# Patient Record
Sex: Female | Born: 1995 | Race: White | Hispanic: No | Smoking: Former smoker
Health system: Southern US, Community
[De-identification: ages and names within clinical notes are randomized; demographics above are authoritative.]

## PROBLEM LIST (undated history)

## (undated) DIAGNOSIS — G90A Postural orthostatic tachycardia syndrome (POTS): Secondary | ICD-10-CM

## (undated) DIAGNOSIS — I951 Orthostatic hypotension: Secondary | ICD-10-CM

## (undated) DIAGNOSIS — J189 Pneumonia, unspecified organism: Secondary | ICD-10-CM

## (undated) DIAGNOSIS — F329 Major depressive disorder, single episode, unspecified: Secondary | ICD-10-CM

## (undated) DIAGNOSIS — K219 Gastro-esophageal reflux disease without esophagitis: Secondary | ICD-10-CM

## (undated) DIAGNOSIS — E119 Type 2 diabetes mellitus without complications: Secondary | ICD-10-CM

## (undated) DIAGNOSIS — R000 Tachycardia, unspecified: Secondary | ICD-10-CM

## (undated) DIAGNOSIS — I498 Other specified cardiac arrhythmias: Secondary | ICD-10-CM

## (undated) DIAGNOSIS — R630 Anorexia: Secondary | ICD-10-CM

## (undated) DIAGNOSIS — Z9889 Other specified postprocedural states: Secondary | ICD-10-CM

## (undated) DIAGNOSIS — F32A Depression, unspecified: Secondary | ICD-10-CM

## (undated) DIAGNOSIS — F419 Anxiety disorder, unspecified: Secondary | ICD-10-CM

## (undated) DIAGNOSIS — I499 Cardiac arrhythmia, unspecified: Secondary | ICD-10-CM

## (undated) HISTORY — PX: NASAL SEPTUM SURGERY: SHX37

## (undated) HISTORY — PX: OVARIAN CYST REMOVAL: SHX89

## (undated) HISTORY — PX: APPENDECTOMY: SHX54

## (undated) HISTORY — PX: DILATION AND CURETTAGE OF UTERUS: SHX78

---
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
## 2017-03-11 ENCOUNTER — Encounter: Payer: Self-pay | Admitting: Certified Nurse Midwife

## 2017-07-11 ENCOUNTER — Encounter: Payer: Self-pay | Admitting: Emergency Medicine

## 2017-07-11 ENCOUNTER — Emergency Department
Admission: EM | Admit: 2017-07-11 | Discharge: 2017-07-11 | Disposition: A | Discharge status: Home / Self Care | Payer: Medicaid Other | Attending: Student in an Organized Health Care Education/Training Program | Admitting: Student in an Organized Health Care Education/Training Program

## 2017-07-11 ENCOUNTER — Other Ambulatory Visit: Payer: Self-pay

## 2017-07-11 DIAGNOSIS — F17210 Nicotine dependence, cigarettes, uncomplicated: Secondary | ICD-10-CM | POA: Insufficient documentation

## 2017-07-11 DIAGNOSIS — J020 Streptococcal pharyngitis: Secondary | ICD-10-CM | POA: Diagnosis not present

## 2017-07-11 DIAGNOSIS — R509 Fever, unspecified: Secondary | ICD-10-CM | POA: Diagnosis present

## 2017-07-11 HISTORY — DX: Depression, unspecified: F32.A

## 2017-07-11 HISTORY — DX: Major depressive disorder, single episode, unspecified: F32.9

## 2017-07-11 HISTORY — DX: Gastro-esophageal reflux disease without esophagitis: K21.9

## 2017-07-11 MED ORDER — AMOXICILLIN 500 MG PO CAPS
500.0000 mg | ORAL_CAPSULE | Freq: Once | ORAL | Status: AC
  Administered 2017-07-11: 500 mg via ORAL
  Filled 2017-07-11: qty 1

## 2017-07-11 MED ORDER — AMOXICILLIN 500 MG PO TABS: 500.0000 mg | ORAL_TABLET | Freq: Two times a day (BID) | ORAL | 0 refills | Status: AC

## 2017-07-11 MED ORDER — DEXAMETHASONE SODIUM PHOSPHATE 10 MG/ML IJ SOLN
10.0000 mg | Freq: Once | INTRAMUSCULAR | Status: AC
  Administered 2017-07-11: 10 mg via INTRAMUSCULAR
  Filled 2017-07-11: qty 1

## 2017-07-11 NOTE — ED Provider Notes (Signed)
[facility] Emergency Department Provider Note  ____________________________________________  Time seen: Approximately 10:34 PM  I have reviewed the triage vital signs and the nursing notes.   HISTORY  Chief Complaint Fever    HPI Alyssa Evans is a 22 y.o. female presents to the emergency department with pharyngitis, headache, diminished appetite and sweats for the past 3 days.  Patient has numerous sick contacts at work.  She reports that she has pain with swallowing but is managing her own secretions.  No associated rhinorrhea, congestion or nonproductive cough.   Past Medical History:  Diagnosis Date  . Acid reflux   . Anorexia   . Depression   . POTS (postural orthostatic tachycardia syndrome)     There are no active problems to display for this patient.   Past Surgical History:  Procedure Laterality Date  . APPENDECTOMY    . NASAL SEPTUM SURGERY      Prior to Admission medications   Medication Sig Start Date End Date Taking? Authorizing Provider  albuterol (PROVENTIL HFA;VENTOLIN HFA) 108 (90 Base) MCG/ACT inhaler Inhale into the lungs. 10/21/14 04/07/16  [provider]  amoxicillin (AMOXIL) 500 MG tablet Take 1 tablet (500 mg total) by mouth 2 (two) times daily for 10 days. 07/11/17 07/21/17  Orvil FeilWoods, Nyla Creason M, PA-C  fluticasone (FLONASE) 50 MCG/ACT nasal spray Place 2 sprays into both nostrils daily. Patient taking differently: Place 2 sprays into both nostrils daily as needed.  09/05/15   Hagler, Jami L, PA-C  sodium chloride 1 g tablet Take 1 tablet (1 g total) by mouth 3 (three) times daily with meals. 12/21/15   End, Cristal Deerhristopher, MD  traZODone (DESYREL) 50 MG tablet Take 50 mg by mouth daily as needed.  10/21/14 04/07/16  [provider]    Allergies Patient has no known allergies.  Family History  Problem Relation Age of Onset  . Hyperlipidemia Mother   . Hypertension Mother   . Diabetes gravidarum Mother    . Heart disease Brother 17       ? slight heart attack  . Syncope episode Brother   . Heart attack Maternal Grandmother     Social History Social History   Tobacco Use  . Smoking status: Current Every Day Smoker    Packs/day: 0.50    Years: 3.00    Pack years: 1.50    Types: Cigarettes  . Smokeless tobacco: Never Used  . Tobacco comment: somedays pt smoke pack a day.  Substance Use Topics  . Alcohol use: No    Alcohol/week: 0.6 oz    Types: 1 Cans of beer per week    Comment: socially  . Drug use: Yes    Types: Marijuana    Comment: None recently     Review of Systems  Constitutional: Patient has fever.  Eyes: No visual changes. No discharge ENT: Patient has pharyngitis.  Cardiovascular: no chest pain. Respiratory: no cough. No SOB. Gastrointestinal: No abdominal pain. Patient has had nausea. No diarrhea.  No constipation. Musculoskeletal: Negative for musculoskeletal pain. Skin: Negative for rash, abrasions, lacerations, ecchymosis. Neurological: Negative for headaches, focal weakness or numbness.  ____________________________________________   PHYSICAL EXAM:  VITAL SIGNS: ED Triage Vitals  Enc Vitals Group     BP 07/11/17 2214 115/80     Pulse Rate 07/11/17 2214 100     Resp 07/11/17 2214 16     Temp 07/11/17 2214 100.2 F (37.9 C)     Temp Source 07/11/17 2214 Oral  SpO2 07/11/17 2214 98 %     Weight 07/11/17 2215 108 lb (49 kg)     Height 07/11/17 2215 5\' 7"  (1.702 m)     Head Circumference --      Peak Flow --      Pain Score 07/11/17 2217 8     Pain Loc --      Pain Edu? --      Excl. in GC? --      Constitutional: Alert and oriented. Well appearing and in no acute distress. Eyes: Conjunctivae are normal. PERRL. EOMI. Head: Atraumatic. ENT:      Ears: TMs are pearly.      Nose: No congestion/rhinnorhea.      Mouth/Throat: Mucous membranes are moist.  Posterior pharynx is erythematous with tonsillar exudate and hypertrophy.  Uvula is  midline. Neck: No stridor.  No cervical spine tenderness to palpation. Hematological/Lymphatic/Immunilogical:  palpable cervical lymphadenopathy. Cardiovascular: Normal rate, regular rhythm. Normal S1 and S2.  Good peripheral circulation. Respiratory: Normal respiratory effort without tachypnea or retractions. Lungs CTAB. Good air entry to the bases with no decreased or absent breath sounds. Gastrointestinal: Bowel sounds 4 quadrants. Soft and nontender to palpation. No guarding or rigidity. No palpable masses. No distention. No CVA tenderness. Skin:  Skin is warm, dry and intact. No rash noted. ____________________________________________   LABS (all labs ordered are listed, but only abnormal results are displayed)  Labs Reviewed - No data to display ____________________________________________  EKG   ____________________________________________  RADIOLOGY   No results found.  ____________________________________________    PROCEDURES  Procedure(s) performed:    Procedures    Medications  amoxicillin (AMOXIL) capsule 500 mg (has no administration in time range)  dexamethasone (DECADRON) injection 10 mg (has no administration in time range)     ____________________________________________   INITIAL IMPRESSION / ASSESSMENT AND PLAN / ED COURSE  Pertinent labs & imaging results that were available during my care of the patient were reviewed by me and considered in my medical decision making (see chart for details).  Review of the [place] CSRS was performed in accordance of the [facility] prior to dispensing any controlled drugs.      Assessment and plan Strep pharyngitis Patient presents to the emergency department with headache, fever, pharyngitis and absence of cough.  History and physical exam findings are consistent with strep pharyngitis.  Patient was treated empirically with amoxicillin and advised to follow-up with primary care as needed.  All patient questions  were answered.    ____________________________________________  FINAL CLINICAL IMPRESSION(S) / ED DIAGNOSES  Final diagnoses:  Strep throat      NEW MEDICATIONS STARTED DURING THIS VISIT:  ED Discharge Orders        Ordered    amoxicillin (AMOXIL) 500 MG tablet  2 times daily     07/11/17 2232          This chart was dictated using voice recognition software/Dragon. Despite best efforts to proofread, errors can occur which can change the meaning. Any change was purely unintentional.    Orvil Feil, PA-C 07/11/17 2239    Willy Eddy, MD 07/11/17 2340

## 2017-07-11 NOTE — ED Triage Notes (Signed)
Pt to ED from home c/o fever, sore throat, and body aches x3 days, last had tylenol around 2100 tonight.

## 2017-07-11 NOTE — ED Notes (Signed)
Pt states she has been having fever for the past 3 days, states she has been taking nyquil or theraflu or mucinex.  Pt also c/o having sweats, body aches, sore throat and some congestion.  Pt also c/o poor appetite.  Pt wrapped in blanket lying on bed.

## 2017-09-11 ENCOUNTER — Other Ambulatory Visit: Payer: Self-pay

## 2017-09-11 ENCOUNTER — Emergency Department
Admission: EM | Admit: 2017-09-11 | Discharge: 2017-09-11 | Disposition: A | Discharge status: Home / Self Care | Payer: Medicaid Other | Attending: Emergency Medicine | Admitting: Emergency Medicine

## 2017-09-11 ENCOUNTER — Encounter: Payer: Self-pay | Admitting: Emergency Medicine

## 2017-09-11 DIAGNOSIS — R21 Rash and other nonspecific skin eruption: Secondary | ICD-10-CM | POA: Insufficient documentation

## 2017-09-11 DIAGNOSIS — L659 Nonscarring hair loss, unspecified: Secondary | ICD-10-CM | POA: Insufficient documentation

## 2017-09-11 NOTE — ED Triage Notes (Signed)
Patient ambulatory to triage with steady gait, without difficulty or distress noted; pt reports noting bald spot to back of scalp with rash to neck

## 2018-01-07 IMAGING — DX DG FOOT COMPLETE 3+V*R*
3 series · 3 of 3 positions shown · non-contrast
Comparison: No prior.

CLINICAL DATA: Right foot pain.  No known injury.

EXAM:
RIGHT FOOT COMPLETE - 3+ VIEW

[foot ap]
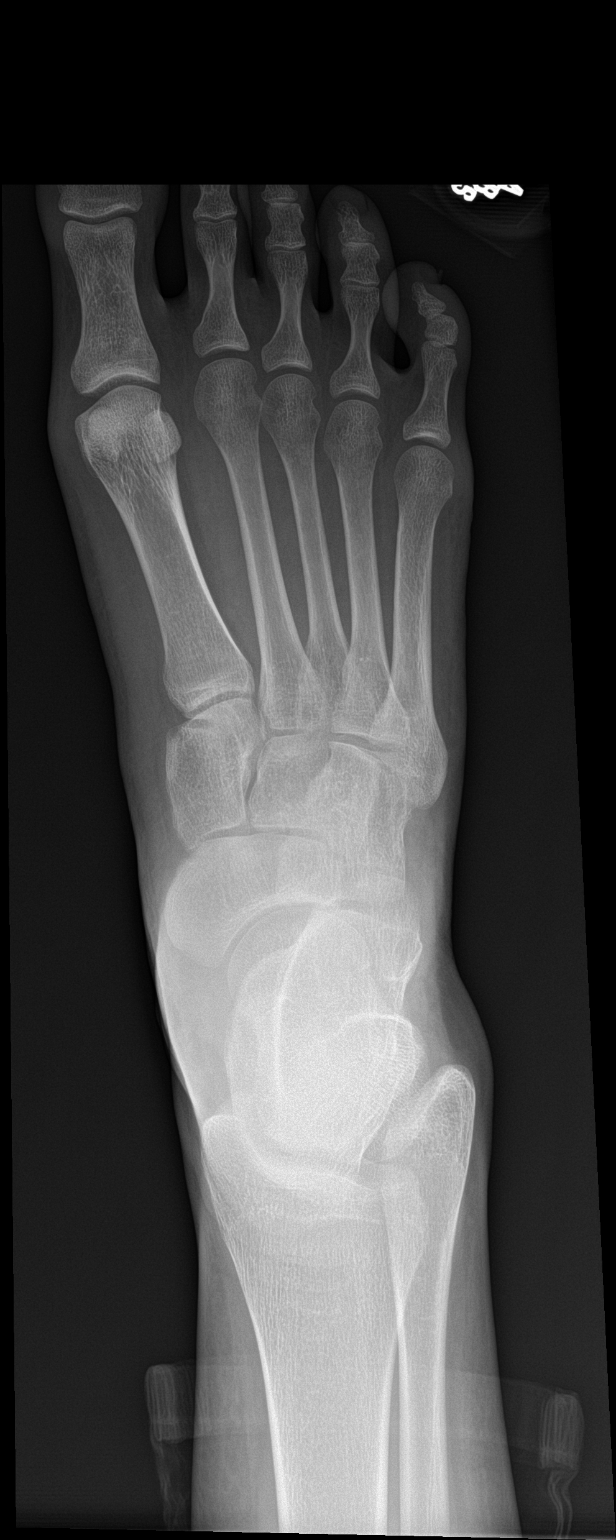

[foot obl]
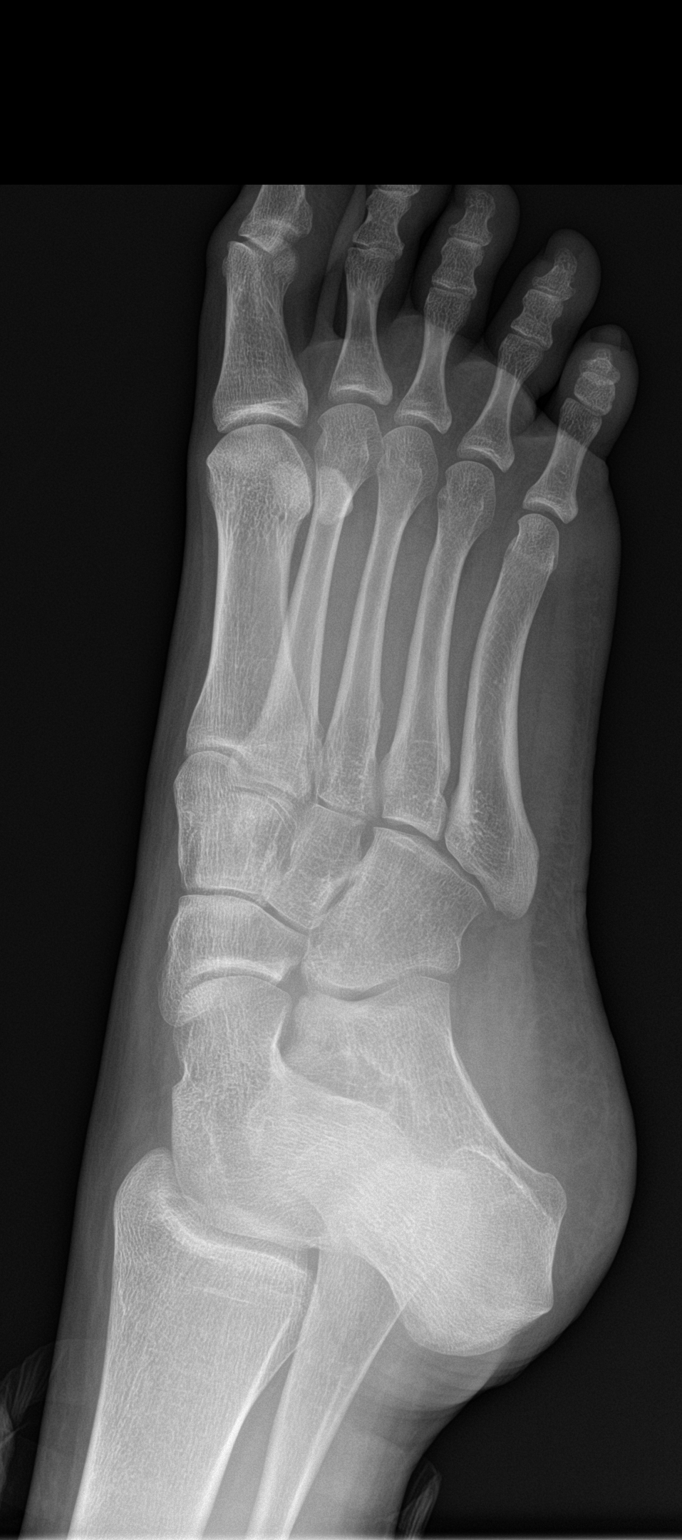

[foot lat]
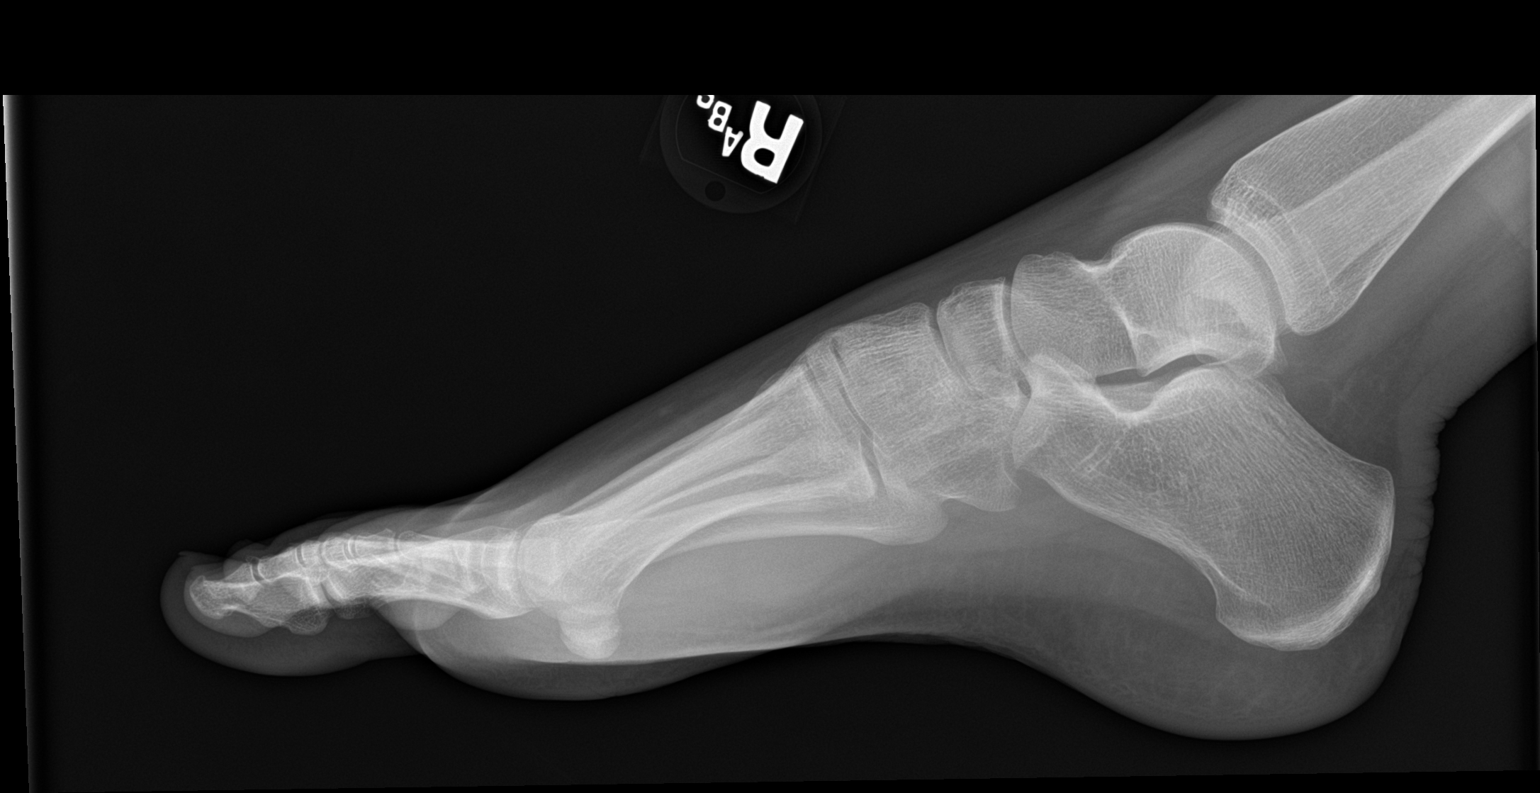

[3 of 3 positions shown; findings below may reference images not displayed]

FINDINGS: No acute bony or joint abnormality. No focal abnormality. No
radiopaque foreign body.
IMPRESSION: No acute abnormality .

## 2018-01-13 IMAGING — CR DG CHEST 2V
2 series · 2 of 2 positions shown · non-contrast
Comparison: None.

CLINICAL DATA: Hematemesis 2 days ago.  Productive cough.

EXAM:
CHEST  2 VIEW

[chest pa]
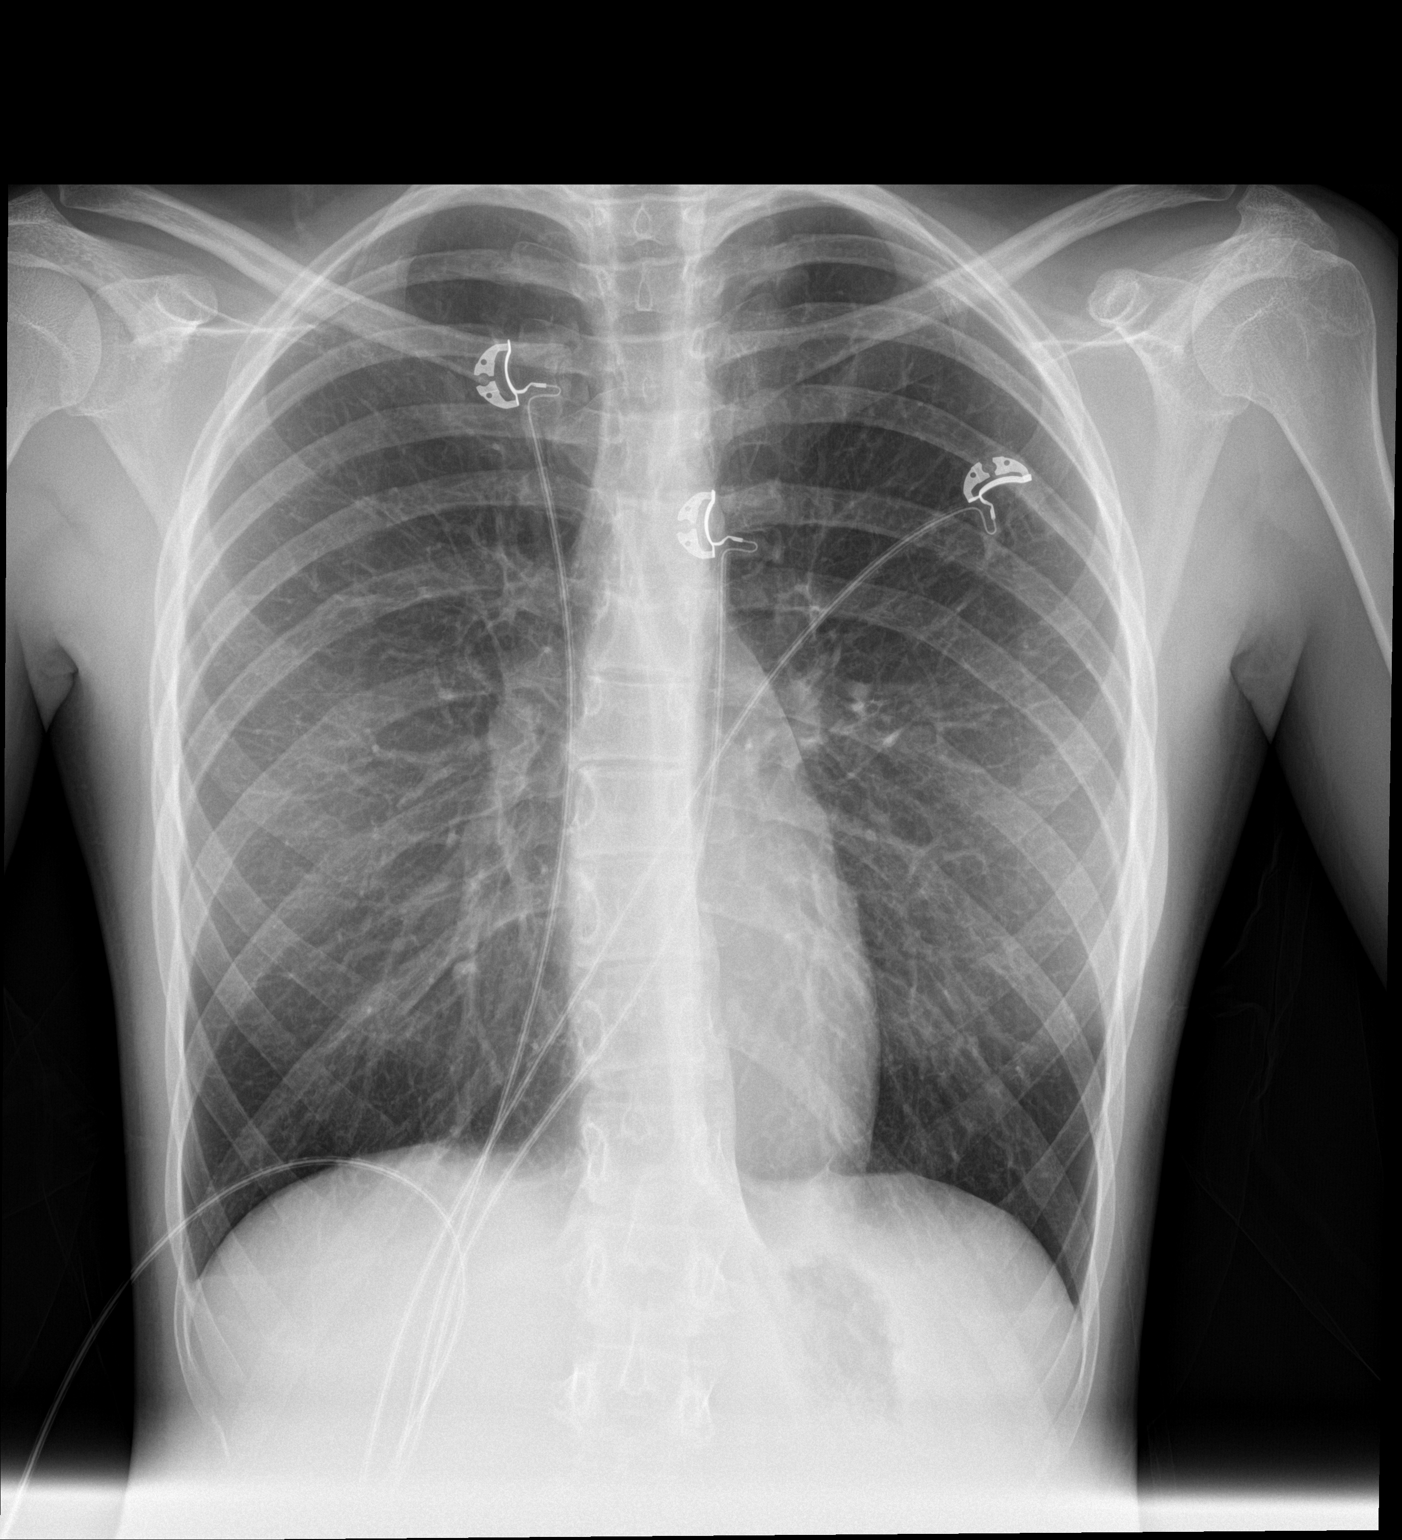

[chest lat]
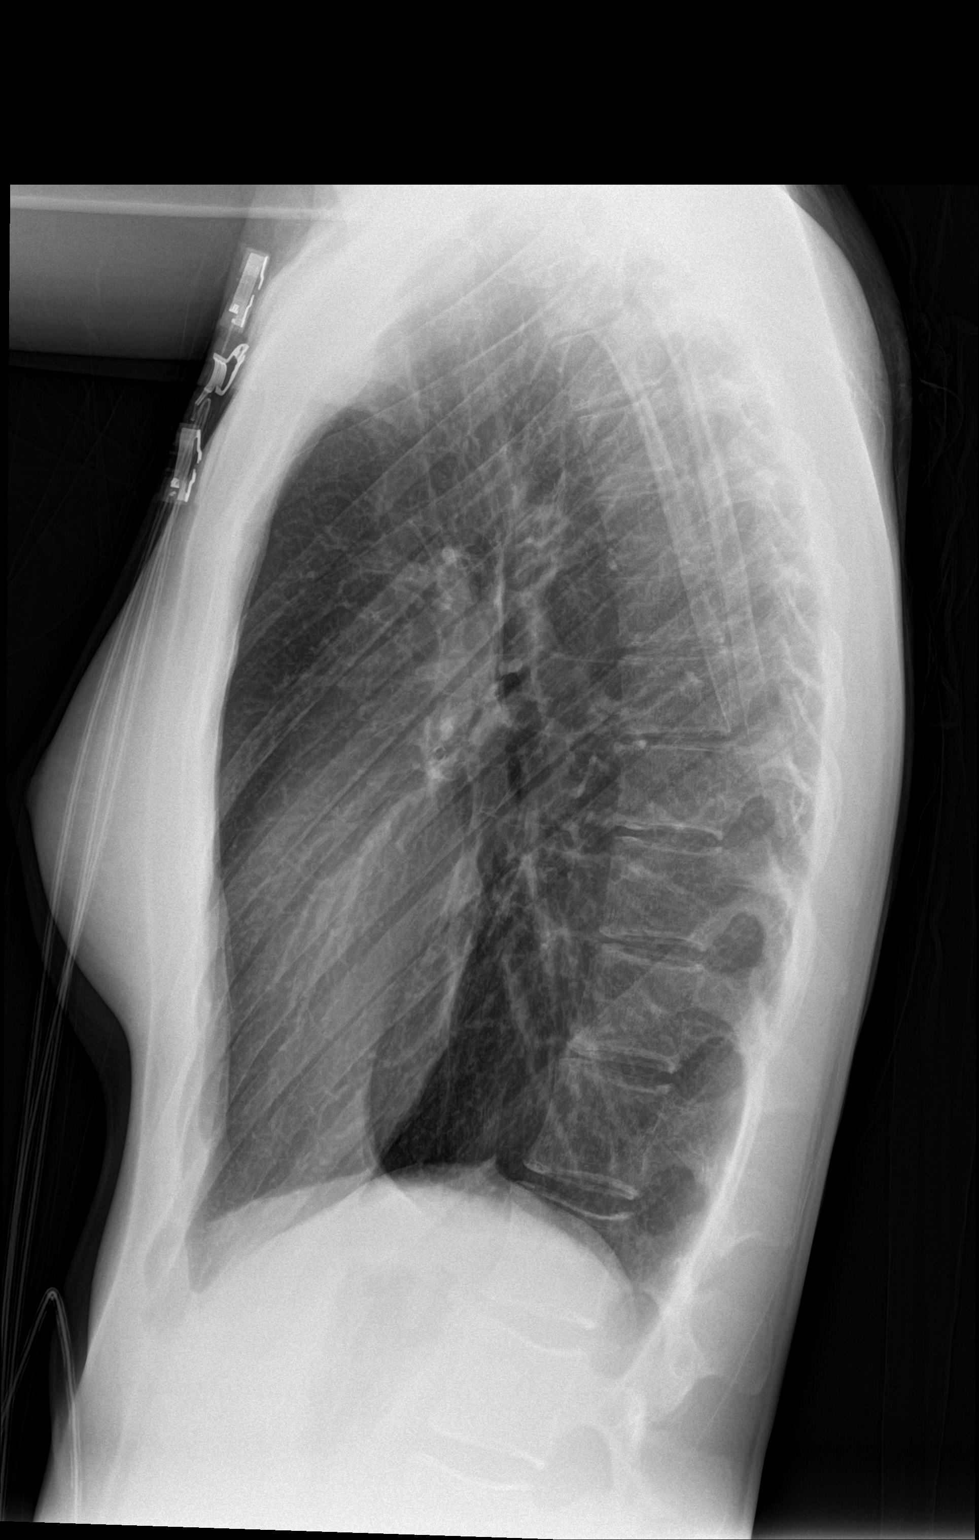

[2 of 2 positions shown; findings below may reference images not displayed]

FINDINGS: Lungs are hyperaerated and clear. Heart is normal in size. No
pneumothorax. No pleural effusion.
IMPRESSION: Hyperaeration.  Otherwise, no active cardiopulmonary disease new

## 2018-03-07 ENCOUNTER — Other Ambulatory Visit: Payer: Self-pay

## 2018-03-07 ENCOUNTER — Emergency Department
Admission: EM | Admit: 2018-03-07 | Discharge: 2018-03-07 | Disposition: A | Discharge status: Home / Self Care | Payer: Self-pay | Attending: Emergency Medicine | Admitting: Emergency Medicine

## 2018-03-07 ENCOUNTER — Encounter: Payer: Self-pay | Admitting: Emergency Medicine

## 2018-03-07 ENCOUNTER — Emergency Department: Payer: Self-pay

## 2018-03-07 DIAGNOSIS — K2900 Acute gastritis without bleeding: Secondary | ICD-10-CM | POA: Insufficient documentation

## 2018-03-07 DIAGNOSIS — Z79899 Other long term (current) drug therapy: Secondary | ICD-10-CM | POA: Insufficient documentation

## 2018-03-07 DIAGNOSIS — F17210 Nicotine dependence, cigarettes, uncomplicated: Secondary | ICD-10-CM | POA: Insufficient documentation

## 2018-03-07 LAB — COMPREHENSIVE METABOLIC PANEL
ALBUMIN: 4.4 g/dL (ref 3.5–5.0)
ALK PHOS: 53 U/L (ref 38–126)
ALT: 17 U/L (ref 0–44)
AST: 22 U/L (ref 15–41)
Anion gap: 8 (ref 5–15)
BILIRUBIN TOTAL: 0.9 mg/dL (ref 0.3–1.2)
BUN: 16 mg/dL (ref 6–20)
CALCIUM: 9.2 mg/dL (ref 8.9–10.3)
CO2: 24 mmol/L (ref 22–32)
Chloride: 109 mmol/L (ref 98–111)
Creatinine, Ser: 0.63 mg/dL (ref 0.44–1.00)
GFR calc Af Amer: >60 mL/min (ref >60)
GFR calc non Af Amer: >60 mL/min (ref >60)
GLUCOSE: 90 mg/dL (ref 70–99)
Potassium: 3.7 mmol/L (ref 3.5–5.1)
Sodium: 141 mmol/L (ref 135–145)
TOTAL PROTEIN: 6.7 g/dL (ref 6.5–8.1)

## 2018-03-07 LAB — URINALYSIS, COMPLETE (UACMP) WITH MICROSCOPIC
BACTERIA UA: NONE SEEN
Bilirubin Urine: NEGATIVE
Glucose, UA: NEGATIVE mg/dL
HGB URINE DIPSTICK: NEGATIVE
KETONES UR: 20 mg/dL — AB
Leukocytes,Ua: NEGATIVE
Nitrite: NEGATIVE
PROTEIN: 30 mg/dL — AB
Specific Gravity, Urine: 1.029 (ref 1.005–1.030)
pH: 5 (ref 5.0–8.0)

## 2018-03-07 LAB — CBC
HEMATOCRIT: 43.5 % (ref 36.0–46.0)
HEMOGLOBIN: 14.9 g/dL (ref 12.0–15.0)
MCH: 34 pg (ref 26.0–34.0)
MCHC: 34.3 g/dL (ref 30.0–36.0)
MCV: 99.3 fL (ref 80.0–100.0)
NRBC: 0 % (ref 0.0–0.2)
Platelets: 152 10*3/uL (ref 150–400)
RBC: 4.38 MIL/uL (ref 3.87–5.11)
RDW: 11.9 % (ref 11.5–15.5)
WBC: 6.2 10*3/uL (ref 4.0–10.5)

## 2018-03-07 LAB — INFLUENZA PANEL BY PCR (TYPE A & B)
INFLBPCR: NEGATIVE
Influenza A By PCR: NEGATIVE

## 2018-03-07 LAB — TROPONIN I: Troponin I: <0.03 ng/mL (ref <0.03)

## 2018-03-07 LAB — FIBRIN DERIVATIVES D-DIMER (ARMC ONLY): FIBRIN DERIVATIVES D-DIMER (ARMC): 143.76 ng{FEU}/mL (ref 0.00–499.00)

## 2018-03-07 LAB — LIPASE, BLOOD: Lipase: 26 U/L (ref 11–51)

## 2018-03-07 LAB — POCT PREGNANCY, URINE: Preg Test, Ur: NEGATIVE

## 2018-03-07 MED ORDER — KETOROLAC TROMETHAMINE 30 MG/ML IJ SOLN
15.0000 mg | Freq: Once | INTRAMUSCULAR | Status: AC
  Administered 2018-03-07: 15 mg via INTRAVENOUS
  Filled 2018-03-07: qty 1

## 2018-03-07 MED ORDER — PROMETHAZINE HCL 25 MG/ML IJ SOLN
12.5000 mg | Freq: Once | INTRAMUSCULAR | Status: AC
  Administered 2018-03-07: 12.5 mg via INTRAVENOUS
  Filled 2018-03-07: qty 1

## 2018-03-07 MED ORDER — CIPROFLOXACIN HCL 500 MG PO TABS: 500.0000 mg | ORAL_TABLET | Freq: Two times a day (BID) | ORAL | 0 refills | Status: AC

## 2018-03-07 MED ORDER — OXYCODONE-ACETAMINOPHEN 5-325 MG PO TABS
1.0000 | ORAL_TABLET | Freq: Once | ORAL | Status: AC
  Administered 2018-03-07: 1 via ORAL
  Filled 2018-03-07: qty 1

## 2018-03-07 MED ORDER — METRONIDAZOLE 500 MG PO TABS: 500.0000 mg | ORAL_TABLET | Freq: Two times a day (BID) | ORAL | 0 refills | Status: DC

## 2018-03-07 MED ORDER — ONDANSETRON HCL 4 MG/2ML IJ SOLN
4.0000 mg | Freq: Once | INTRAMUSCULAR | Status: DC
  Filled 2018-03-07: qty 2

## 2018-03-07 MED ORDER — ONDANSETRON 4 MG PO TBDP
4.0000 mg | ORAL_TABLET | Freq: Once | ORAL | Status: AC
  Administered 2018-03-07: 4 mg via ORAL
  Filled 2018-03-07: qty 1

## 2018-03-07 MED ORDER — PROMETHAZINE HCL 12.5 MG PO TABS: 12.5000 mg | ORAL_TABLET | Freq: Four times a day (QID) | ORAL | 0 refills | Status: DC | PRN

## 2018-03-07 MED ORDER — PANTOPRAZOLE SODIUM 40 MG IV SOLR
40.0000 mg | Freq: Once | INTRAVENOUS | Status: AC
  Administered 2018-03-07: 40 mg via INTRAVENOUS
  Filled 2018-03-07: qty 40

## 2018-03-07 MED ORDER — IOHEXOL 300 MG/ML  SOLN
75.0000 mL | Freq: Once | INTRAMUSCULAR | Status: AC | PRN
  Administered 2018-03-07: 75 mL via INTRAVENOUS
  Filled 2018-03-07: qty 75

## 2018-03-07 MED ORDER — OMEPRAZOLE 40 MG PO CPDR: 40.0000 mg | DELAYED_RELEASE_CAPSULE | Freq: Every day | ORAL | 0 refills | Status: DC

## 2018-03-07 NOTE — ED Provider Notes (Signed)
-----------------------------------------   3:59 PM on 03/07/2018 -----------------------------------------   Blood pressure (!) 127/95, pulse (!) 108, temperature 98.3 F (36.8 C), temperature source Oral, resp. rate 16, height 5\' 7"  (1.702 m), weight 52.2 kg, last menstrual period 02/07/2018, SpO2 100 %.  Assuming care from Enid Derry, PA-C.  In short, Alyssa Evans is a 23 y.o. female with a chief complaint of Nausea .  Refer to the original H&P for additional details.  The current plan of care is to review results of the CT abdomen and pelvis and set disposition.   ----------------------------------------- 4:23 PM on 03/07/2018 -----------------------------------------  CT results discussed with patient. She states that the IV contrast has made her feel nauseated again and the upper abdominal pain has not improved. Phenergan, toradol, and Protonix ordered. Will reevaluate after meds.  ----------------------------------------- 5:31 PM on 03/07/2018 -----------------------------------------  Patient now states that she was initially to embarrassed to admit to having diarrhea for the past 2 days. She states that is has been very loose and has had several episodes.   With the protonix and phenergan, she is feeling better. She will be treated with flagyl and ciprofloxacin for presumed infectious cause of gastritis and will be given omeprazole and phenergan for nausea and abdominal pain. She is also to schedule a follow up with gastroenterology. She is to return to the ER for symptoms that change or worsen or for new concerns if unable to schedule an appointment.      Chinita Pester, FNP 03/07/18 1812    Arnaldo Natal, MD 03/08/18 1059

## 2018-03-07 NOTE — ED Triage Notes (Signed)
Pt here with c/o bilateral rib pain and nausea that began yesterday, NAD.

## 2018-03-07 NOTE — ED Provider Notes (Signed)
[facility] Emergency Department Provider Note  ____________________________________________  Time seen: Approximately 12:29 PM  I have reviewed the triage vital signs and the nursing notes.   HISTORY  Chief Complaint Nausea    HPI Alyssa Evans is a 23 y.o. female that presents to the emergency department for evaluation of bilateral anterior rib pain and nausea that began this morning.  Patient has been staying in the hospital with her grandmother for the last 5 days.  She has not taken anything for pain.  No fever, cough, shortness of breath, chest pain, vomiting.   Past Medical History:  Diagnosis Date  . Acid reflux   . Anorexia   . Depression   . POTS (postural orthostatic tachycardia syndrome)     There are no active problems to display for this patient.   Past Surgical History:  Procedure Laterality Date  . APPENDECTOMY    . NASAL SEPTUM SURGERY      Prior to Admission medications   Medication Sig Start Date End Date Taking? Authorizing Provider  albuterol (PROVENTIL HFA;VENTOLIN HFA) 108 (90 Base) MCG/ACT inhaler Inhale into the lungs. 10/21/14 04/07/16  [provider]  fluticasone (FLONASE) 50 MCG/ACT nasal spray Place 2 sprays into both nostrils daily. Patient taking differently: Place 2 sprays into both nostrils daily as needed.  09/05/15   Hagler, Jami L, PA-C  sodium chloride 1 g tablet Take 1 tablet (1 g total) by mouth 3 (three) times daily with meals. 12/21/15   End, Cristal Deer, MD  traZODone (DESYREL) 50 MG tablet Take 50 mg by mouth daily as needed.  10/21/14 04/07/16  [provider]    Allergies Patient has no known allergies.  Family History  Problem Relation Age of Onset  . Hyperlipidemia Mother   . Hypertension Mother   . Diabetes gravidarum Mother   . Heart disease Brother 17       ? slight heart attack  . Syncope episode Brother   . Heart attack Maternal Grandmother     Social  History Social History   Tobacco Use  . Smoking status: Current Every Day Smoker    Packs/day: 0.50    Years: 3.00    Pack years: 1.50    Types: Cigarettes  . Smokeless tobacco: Never Used  . Tobacco comment: somedays pt smoke pack a day.  Substance Use Topics  . Alcohol use: No    Alcohol/week: 1.0 standard drinks    Types: 1 Cans of beer per week    Comment: socially  . Drug use: Yes    Types: Marijuana    Comment: None recently     Review of Systems  Constitutional: No fever/chills Cardiovascular: No chest pain. Respiratory: No cough. No SOB. Gastrointestinal: Positive for nausea and abdominal pain.  No vomiting.  Musculoskeletal: Positive for rib pain. Skin: Negative for rash, abrasions, lacerations, ecchymosis. Neurological: Negative for headaches, numbness or tingling   ____________________________________________   PHYSICAL EXAM:  VITAL SIGNS: ED Triage Vitals  Enc Vitals Group     BP 03/07/18 1127 (!) 127/95     Pulse Rate 03/07/18 1127 (!) 108     Resp 03/07/18 1127 16     Temp 03/07/18 1127 98.3 F (36.8 C)     Temp Source 03/07/18 1127 Oral     SpO2 03/07/18 1127 100 %     Weight 03/07/18 1122 115 lb (52.2 kg)     Height 03/07/18 1122 5\' 7"  (1.702 m)     Head Circumference --  Peak Flow --      Pain Score 03/07/18 1122 9     Pain Loc --      Pain Edu? --      Excl. in GC? --      Constitutional: Alert and oriented. Well appearing and in no acute distress. Eyes: Conjunctivae are normal. PERRL. EOMI. Head: Atraumatic. ENT:      Ears:      Nose: No congestion/rhinnorhea.      Mouth/Throat: Mucous membranes are moist.  Neck: No stridor. Cardiovascular: Normal rate, regular rhythm.  Good peripheral circulation. Respiratory: Normal respiratory effort without tachypnea or retractions. Lungs CTAB. Good air entry to the bases with no decreased or absent breath sounds. Gastrointestinal: Bowel sounds 4 quadrants. Tenderness to palpation to  [place] point. Right and Left upper quadrant diffuse tenderness to palpation. No guarding or rigidity. No palpable masses. No distention.  Musculoskeletal: Full range of motion to all extremities. No gross deformities appreciated.  Tenderness to palpation to bilateral anterior inferior rib cage. Neurologic:  Normal speech and language. No gross focal neurologic deficits are appreciated.  Skin:  Skin is warm, dry and intact. No rash noted. Psychiatric: Mood and affect are normal. Speech and behavior are normal. Patient exhibits appropriate insight and judgement.   ____________________________________________   LABS (all labs ordered are listed, but only abnormal results are displayed)  Labs Reviewed  URINALYSIS, COMPLETE (UACMP) WITH MICROSCOPIC - Abnormal; Notable for the following components:      Result Value   Color, Urine YELLOW (*)    APPearance TURBID (*)    Ketones, ur 20 (*)    Protein, ur 30 (*)    All other components within normal limits  CBC  COMPREHENSIVE METABOLIC PANEL  LIPASE, BLOOD  TROPONIN I  FIBRIN DERIVATIVES D-DIMER ([facility] ONLY)  INFLUENZA PANEL BY PCR (TYPE A & B)  POCT PREGNANCY, URINE   ____________________________________________  EKG  SB ____________________________________________  RADIOLOGY   Dg Chest 2 View  Result Date: 03/07/2018 CLINICAL DATA:  Pain.  No injury. EXAM: CHEST - 2 VIEW COMPARISON:  04/07/2016. FINDINGS: Mediastinum hilar structures normal. Lungs are clear. No pleural effusion or pneumothorax. Heart size normal. Thoracic spine scoliosis. IMPRESSION: No acute cardiopulmonary disease. Electronically Signed   By: Maisie Fus  Register   On: 03/07/2018 13:11    ____________________________________________    PROCEDURES  Procedure(s) performed:    Procedures    Medications  oxyCODONE-acetaminophen (PERCOCET/ROXICET) 5-325 MG per tablet 1 tablet (1 tablet Oral Given 03/07/18 1242)  ondansetron (ZOFRAN-ODT) disintegrating  tablet 4 mg (4 mg Oral Given 03/07/18 1242)     ____________________________________________   INITIAL IMPRESSION / ASSESSMENT AND PLAN / ED COURSE  Pertinent labs & imaging results that were available during my care of the patient were reviewed by me and considered in my medical decision making (see chart for details).  Review of the [place] CSRS was performed in accordance of the [facility] prior to dispensing any controlled drugs.   Patient presented to the emergency department for evaluation of bilateral rib pain and nausea this morning. Vital signs are reassuring. Chest xray negative for acute cardiopulmonary processes. EKG shows SB. CBC, CMP, troponin, lipase, d dimer within normal limits. CT abdomen and pelvis ordered. Care with be transferred to Loralyn Freshwater NP for reevaluation and pending CT results.     ____________________________________________  FINAL CLINICAL IMPRESSION(S) / ED DIAGNOSES  Final diagnoses:  None      NEW MEDICATIONS STARTED DURING THIS VISIT:  ED Discharge Orders  None          This chart was dictated using voice recognition software/Dragon. Despite best efforts to proofread, errors can occur which can change the meaning. Any change was purely unintentional.    Enid Derry, PA-C 03/07/18 1536    Arnaldo Natal, MD 03/07/18 [phone]

## 2018-03-07 NOTE — Discharge Instructions (Signed)
Please call and schedule an appointment with the gastroenterologist.   Take the antibiotics and other medications as prescribed and until finished.   Do not drink alcohol while taking the flagyl.  Return to the ER or see your primary care provider for symptoms that change or worsen or for new concerns.

## 2018-03-10 ENCOUNTER — Telehealth: Payer: Self-pay | Admitting: Gastroenterology

## 2018-03-10 NOTE — Telephone Encounter (Signed)
Tried calling pt to schedule ed f/u with Dr. Allegra Lai busy tone no Vm set up

## 2018-03-11 ENCOUNTER — Telehealth: Payer: Self-pay | Admitting: Gastroenterology

## 2018-03-11 NOTE — Telephone Encounter (Signed)
I tried calling patient & the phone rung & the a rapid busy signal came on the line. The patient needs s ed f/u per Dr Allegra Lai.

## 2018-03-12 NOTE — Telephone Encounter (Signed)
Buys tone unable to leave message, pt needs to schedule ed f/u with Dr. Allegra Lai

## 2018-03-17 ENCOUNTER — Encounter: Payer: Self-pay | Admitting: Gastroenterology

## 2018-03-24 ENCOUNTER — Emergency Department (HOSPITAL_COMMUNITY): Payer: Self-pay

## 2018-03-24 ENCOUNTER — Other Ambulatory Visit: Payer: Self-pay

## 2018-03-24 ENCOUNTER — Emergency Department (HOSPITAL_COMMUNITY)
Admission: EM | Admit: 2018-03-24 | Discharge: 2018-03-24 | Disposition: A | Discharge status: Home / Self Care | Payer: Self-pay | Attending: Emergency Medicine | Admitting: Emergency Medicine

## 2018-03-24 ENCOUNTER — Encounter (HOSPITAL_COMMUNITY): Payer: Self-pay | Admitting: Emergency Medicine

## 2018-03-24 DIAGNOSIS — Z79899 Other long term (current) drug therapy: Secondary | ICD-10-CM | POA: Insufficient documentation

## 2018-03-24 DIAGNOSIS — N83201 Unspecified ovarian cyst, right side: Secondary | ICD-10-CM | POA: Insufficient documentation

## 2018-03-24 DIAGNOSIS — F17210 Nicotine dependence, cigarettes, uncomplicated: Secondary | ICD-10-CM | POA: Insufficient documentation

## 2018-03-24 LAB — CBC
HCT: 45.4 % (ref 36.0–46.0)
Hemoglobin: 15.1 g/dL — ABNORMAL HIGH (ref 12.0–15.0)
MCH: 33 pg (ref 26.0–34.0)
MCHC: 33.3 g/dL (ref 30.0–36.0)
MCV: 99.1 fL (ref 80.0–100.0)
Platelets: 154 10*3/uL (ref 150–400)
RBC: 4.58 MIL/uL (ref 3.87–5.11)
RDW: 11.9 % (ref 11.5–15.5)
WBC: 6.8 10*3/uL (ref 4.0–10.5)
nRBC: 0 % (ref 0.0–0.2)

## 2018-03-24 LAB — URINALYSIS, ROUTINE W REFLEX MICROSCOPIC
Bilirubin Urine: NEGATIVE
GLUCOSE, UA: NEGATIVE mg/dL
Hgb urine dipstick: NEGATIVE
Ketones, ur: NEGATIVE mg/dL
Nitrite: NEGATIVE
Protein, ur: NEGATIVE mg/dL
Specific Gravity, Urine: 1.016 (ref 1.005–1.030)
pH: 7 (ref 5.0–8.0)

## 2018-03-24 LAB — COMPREHENSIVE METABOLIC PANEL
ALT: 15 U/L (ref 0–44)
AST: 23 U/L (ref 15–41)
Albumin: 4.5 g/dL (ref 3.5–5.0)
Alkaline Phosphatase: 58 U/L (ref 38–126)
Anion gap: 9 (ref 5–15)
BUN: 9 mg/dL (ref 6–20)
CO2: 22 mmol/L (ref 22–32)
Calcium: 9.5 mg/dL (ref 8.9–10.3)
Chloride: 108 mmol/L (ref 98–111)
Creatinine, Ser: 0.72 mg/dL (ref 0.44–1.00)
GFR calc Af Amer: >60 mL/min (ref >60)
GFR calc non Af Amer: >60 mL/min (ref >60)
Glucose, Bld: 85 mg/dL (ref 70–99)
Potassium: 3.7 mmol/L (ref 3.5–5.1)
Sodium: 139 mmol/L (ref 135–145)
Total Bilirubin: 0.5 mg/dL (ref 0.3–1.2)
Total Protein: 6.8 g/dL (ref 6.5–8.1)

## 2018-03-24 LAB — WET PREP, GENITAL
SPERM: NONE SEEN
Trich, Wet Prep: NONE SEEN
Yeast Wet Prep HPF POC: NONE SEEN

## 2018-03-24 LAB — I-STAT BETA HCG BLOOD, ED (MC, WL, AP ONLY): I-stat hCG, quantitative: <5 m[IU]/mL (ref <5)

## 2018-03-24 LAB — LIPASE, BLOOD: LIPASE: 31 U/L (ref 11–51)

## 2018-03-24 MED ORDER — KETOROLAC TROMETHAMINE 30 MG/ML IJ SOLN
30.0000 mg | Freq: Once | INTRAMUSCULAR | Status: AC
  Administered 2018-03-24: 30 mg via INTRAVENOUS
  Filled 2018-03-24: qty 1

## 2018-03-24 MED ORDER — ACETAMINOPHEN 325 MG PO TABS
650.0000 mg | ORAL_TABLET | Freq: Once | ORAL | Status: AC
  Administered 2018-03-24: 650 mg via ORAL
  Filled 2018-03-24: qty 2

## 2018-03-24 MED ORDER — NAPROXEN 500 MG PO TABS: 500.0000 mg | ORAL_TABLET | Freq: Two times a day (BID) | ORAL | 0 refills | Status: DC

## 2018-03-24 MED ORDER — SODIUM CHLORIDE 0.9% FLUSH
3.0000 mL | Freq: Once | INTRAVENOUS | Status: AC
  Administered 2018-03-24: 3 mL via INTRAVENOUS

## 2018-03-24 MED ORDER — ONDANSETRON 4 MG PO TBDP: 4.0000 mg | ORAL_TABLET | Freq: Three times a day (TID) | ORAL | 0 refills | Status: DC | PRN

## 2018-03-24 MED ORDER — OXYCODONE-ACETAMINOPHEN 5-325 MG PO TABS
1.0000 | ORAL_TABLET | Freq: Once | ORAL | Status: AC
  Administered 2018-03-24: 1 via ORAL
  Filled 2018-03-24: qty 1

## 2018-03-24 NOTE — ED Triage Notes (Signed)
Pt with c/o flu like symptoms body aches and chills. She reports abdominal pain with nausea. Denies fever.

## 2018-03-24 NOTE — ED Provider Notes (Signed)
[facility] EMERGENCY DEPARTMENT Provider Note   CSN: [id number] Arrival date & time: 03/24/18  1846    History   Chief Complaint Chief Complaint  Patient presents with   Generalized Body Aches   Abdominal Pain    HPI Alyssa Evans is a 23 y.o. female is here for evaluation of abdominal pain.  Sudden onset while at work today.  Located to the right lower quadrant with radiation down to the suprapubic region.  Initially was severe, 10 out of 10, she bent down due to the severity.  Could not stand up for a few minutes.  Now it is a 5/10, dull.  When the pain began she broke out in a sweat and got nauseous.  Aggravated with palpation.  Associated with right low back pain.  Over the last 4 to 5 days has had "flulike symptoms" as well including sore throat, muscle soreness, dry cough, headaches, sweats, nausea, fatigue.  No interventions.  No alleviating or aggravating factors.  2 days ago had some dysuria and thought it was a UTI but this has resolved.  She had increased vaginal discharge a few days ago but this has resolved.  LMP January 27 through February 2.  She is late on her period.  She is sexually active with one female partner with inconsistent condom use.  Status post appendectomy.  No history of PID, STDs, ovarian cysts.  No history of kidney stones.  She denies fever, vomiting, diarrhea, constipation, abnormal vaginal bleeding.  Is not concerned for STDs.     HPI  Past Medical History:  Diagnosis Date   Acid reflux    Anorexia    Depression    POTS (postural orthostatic tachycardia syndrome)     There are no active problems to display for this patient.   Past Surgical History:  Procedure Laterality Date   APPENDECTOMY     NASAL SEPTUM SURGERY       OB History   No obstetric history on file.      Home Medications    Prior to Admission medications   Medication Sig Start Date End Date Taking? Authorizing Provider  albuterol  (PROVENTIL HFA;VENTOLIN HFA) 108 (90 Base) MCG/ACT inhaler Inhale into the lungs. 10/21/14 04/07/16  [provider]  fluticasone (FLONASE) 50 MCG/ACT nasal spray Place 2 sprays into both nostrils daily. Patient taking differently: Place 2 sprays into both nostrils daily as needed.  09/05/15   Hagler, Jami L, PA-C  metroNIDAZOLE (FLAGYL) 500 MG tablet Take 1 tablet (500 mg total) by mouth 2 (two) times daily. 03/07/18   Triplett, Cari B, FNP  naproxen (NAPROSYN) 500 MG tablet Take 1 tablet (500 mg total) by mouth 2 (two) times daily. 03/24/18   Liberty Handy, PA-C  omeprazole (PRILOSEC) 40 MG capsule Take 1 capsule (40 mg total) by mouth daily for 30 days. 03/07/18 04/06/18  Triplett, Rulon Eisenmenger B, FNP  ondansetron (ZOFRAN ODT) 4 MG disintegrating tablet Take 1 tablet (4 mg total) by mouth every 8 (eight) hours as needed for nausea or vomiting. 03/24/18   Liberty Handy, PA-C  promethazine (PHENERGAN) 12.5 MG tablet Take 1 tablet (12.5 mg total) by mouth every 6 (six) hours as needed for nausea or vomiting. 03/07/18   Triplett, Cari B, FNP  sodium chloride 1 g tablet Take 1 tablet (1 g total) by mouth 3 (three) times daily with meals. 12/21/15   End, Cristal Deer, MD  traZODone (DESYREL) 50 MG tablet Take 50 mg by mouth  daily as needed.  10/21/14 04/07/16  [provider]    Family History Family History  Problem Relation Age of Onset   Hyperlipidemia Mother    Hypertension Mother    Diabetes gravidarum Mother    Heart disease Brother 82       ? slight heart attack   Syncope episode Brother    Heart attack Maternal Grandmother     Social History Social History   Tobacco Use   Smoking status: Current Every Day Smoker    Packs/day: 0.50    Years: 3.00    Pack years: 1.50    Types: Cigarettes   Smokeless tobacco: Never Used   Tobacco comment: somedays pt smoke pack a day.  Substance Use Topics   Alcohol use: Yes    Alcohol/week: 1.0 standard drinks    Types: 1  Cans of beer per week    Comment: socially   Drug use: Yes    Types: Marijuana     Allergies   Patient has no known allergies.   Review of Systems Review of Systems  Constitutional: Positive for chills, diaphoresis, fatigue and fever (Subjective).  HENT: Positive for sore throat.   Respiratory: Positive for cough.   Gastrointestinal: Positive for abdominal pain and nausea.  Genitourinary: Positive for dysuria (Resolved), menstrual problem (Menstrual cycle is delayed) and vaginal discharge (Resolved).  Musculoskeletal: Positive for myalgias.  Neurological: Positive for headaches.  All other systems reviewed and are negative.    Physical Exam Updated Vital Signs BP 127/74 (BP Location: Right Arm)    Pulse 71    Temp 98.4 F (36.9 C) (Oral)    Resp 16    SpO2 100%   Physical Exam Vitals signs and nursing note reviewed. Exam conducted with a chaperone present.  Constitutional:      Appearance: She is well-developed.     Comments: Non toxic.  Thin.  Boyfriend at bedside.  HENT:     Head: Normocephalic and atraumatic.     Nose: Nose normal.     Mouth/Throat:     Comments: Moist mucous membranes.  Oropharynx and tonsils normal. Eyes:     Conjunctiva/sclera: Conjunctivae normal.     Pupils: Pupils are equal, round, and reactive to light.  Neck:     Musculoskeletal: Normal range of motion.  Cardiovascular:     Rate and Rhythm: Normal rate and regular rhythm.  Pulmonary:     Effort: Pulmonary effort is normal.     Breath sounds: Normal breath sounds.     Comments: Lungs clear.  Normal work of breathing. Abdominal:     General: Bowel sounds are normal.     Palpations: Abdomen is soft.     Tenderness: There is abdominal tenderness.       Comments: Very low right lower/pelvic tenderness.  Mild suprapubic tenderness.  Mild right, low CVA tenderness.  Negative Murphy's and McBurney's.  No guarding, rigidity, rebound.  Active bowel sounds to lower quadrants.   Genitourinary:    Adnexa:        Right: Tenderness and fullness present.      Comments:  Exam performed with EMT at bedside for assistance. External genitalia without lesions.  No groin lymphadenopathy.  Vaginal mucosa and cervix pink without lesions.  Scant likely physiologic clear/white discharge in vaginal vault noted.  Negative whiff test. No CMT.  Nonpalpable, nontender LEFT adnexa. Obviously palpable, tender RIGHT adnexa. Perianal skin normal without lesions.  No inguinal hernia/bulging.  Musculoskeletal: Normal range of motion.  Skin:    General: Skin is warm and dry.     Capillary Refill: Capillary refill takes less than 2 seconds.  Neurological:     Mental Status: She is alert and oriented to person, place, and time.  Psychiatric:        Behavior: Behavior normal.      ED Treatments / Results  Labs (all labs ordered are listed, but only abnormal results are displayed) Labs Reviewed  WET PREP, GENITAL - Abnormal; Notable for the following components:      Result Value   Clue Cells Wet Prep HPF POC PRESENT (*)    WBC, Wet Prep HPF POC FEW (*)    All other components within normal limits  CBC - Abnormal; Notable for the following components:   Hemoglobin 15.1 (*)    All other components within normal limits  URINALYSIS, ROUTINE W REFLEX MICROSCOPIC - Abnormal; Notable for the following components:   APPearance HAZY (*)    Leukocytes,Ua MODERATE (*)    Bacteria, UA RARE (*)    All other components within normal limits  LIPASE, BLOOD  COMPREHENSIVE METABOLIC PANEL  I-STAT BETA HCG BLOOD, ED ([facility], [facility], [facility] ONLY)  GC/CHLAMYDIA PROBE AMP ([place]) NOT AT [facility]    EKG None  Radiology US Transvaginal Non-ob  Result Date: 03/24/2018 CLINICAL DATA:  Sudden onset right lobe pelvic pain. EXAM: TRANSABDOMINAL AND TRANSVAGINAL ULTRASOUND OF PELVIS DOPPLER ULTRASOUND OF OVARIES TECHNIQUE: Both transabdominal and transvaginal ultrasound examinations of the pelvis were  performed. Transabdominal technique was performed for global imaging of the pelvis including uterus, ovaries, adnexal regions, and pelvic cul-de-sac. It was necessary to proceed with endovaginal exam following the transabdominal exam to visualize the uterus, endometrium and ovaries/adnexa to better advantage. Color and duplex Doppler ultrasound was utilized to evaluate blood flow to the ovaries. COMPARISON:  CT, 03/07/2018 FINDINGS: Uterus Measurements: 7.0 x 3.4 x 4.0 cm = volume: 48.3 mL. No fibroids or other mass visualized. Endometrium Thickness: 10 mm.  No focal abnormality visualized. Right ovary Measurements: 7.3 x 5.5 x 6.7 cm = volume: 138.7 mL. Ovary enlarged by a dominant simple appearing cyst that measures 6.1 x 4.6 x 5.9 cm. No other right ovary abnormality. No right adnexal masses. Left ovary Measurements: 2.9 x 2.0 x 2.8 cm = volume: 8.3 mL. Normal appearance/no adnexal mass. Pulsed Doppler evaluation of both ovaries demonstrates normal low-resistance arterial and venous waveforms. Other findings Small amount of cul-de-sac free fluid presumed physiologic. IMPRESSION: 1. Dominant right ovarian cyst, simple in appearance, measuring 6.1 cm in greatest dimension. 2. No other abnormality.  No ovarian torsion. Electronically Signed   By: Amie Portland M.D.   On: 03/24/2018 22:39   US Pelvis Complete  Result Date: 03/24/2018 CLINICAL DATA:  Sudden onset right lobe pelvic pain. EXAM: TRANSABDOMINAL AND TRANSVAGINAL ULTRASOUND OF PELVIS DOPPLER ULTRASOUND OF OVARIES TECHNIQUE: Both transabdominal and transvaginal ultrasound examinations of the pelvis were performed. Transabdominal technique was performed for global imaging of the pelvis including uterus, ovaries, adnexal regions, and pelvic cul-de-sac. It was necessary to proceed with endovaginal exam following the transabdominal exam to visualize the uterus, endometrium and ovaries/adnexa to better advantage. Color and duplex Doppler ultrasound was  utilized to evaluate blood flow to the ovaries. COMPARISON:  CT, 03/07/2018 FINDINGS: Uterus Measurements: 7.0 x 3.4 x 4.0 cm = volume: 48.3 mL. No fibroids or other mass visualized. Endometrium Thickness: 10 mm.  No focal abnormality visualized. Right ovary Measurements: 7.3 x 5.5 x 6.7 cm = volume:  138.7 mL. Ovary enlarged by a dominant simple appearing cyst that measures 6.1 x 4.6 x 5.9 cm. No other right ovary abnormality. No right adnexal masses. Left ovary Measurements: 2.9 x 2.0 x 2.8 cm = volume: 8.3 mL. Normal appearance/no adnexal mass. Pulsed Doppler evaluation of both ovaries demonstrates normal low-resistance arterial and venous waveforms. Other findings Small amount of cul-de-sac free fluid presumed physiologic. IMPRESSION: 1. Dominant right ovarian cyst, simple in appearance, measuring 6.1 cm in greatest dimension. 2. No other abnormality.  No ovarian torsion. Electronically Signed   By: Amie Portland M.D.   On: 03/24/2018 22:39   [place] Art/ven Flow Abd Pelv Doppler  Result Date: 03/24/2018 CLINICAL DATA:  Sudden onset right lobe pelvic pain. EXAM: TRANSABDOMINAL AND TRANSVAGINAL ULTRASOUND OF PELVIS DOPPLER ULTRASOUND OF OVARIES TECHNIQUE: Both transabdominal and transvaginal ultrasound examinations of the pelvis were performed. Transabdominal technique was performed for global imaging of the pelvis including uterus, ovaries, adnexal regions, and pelvic cul-de-sac. It was necessary to proceed with endovaginal exam following the transabdominal exam to visualize the uterus, endometrium and ovaries/adnexa to better advantage. Color and duplex Doppler ultrasound was utilized to evaluate blood flow to the ovaries. COMPARISON:  CT, 03/07/2018 FINDINGS: Uterus Measurements: 7.0 x 3.4 x 4.0 cm = volume: 48.3 mL. No fibroids or other mass visualized. Endometrium Thickness: 10 mm.  No focal abnormality visualized. Right ovary Measurements: 7.3 x 5.5 x 6.7 cm = volume: 138.7 mL. Ovary enlarged by a dominant  simple appearing cyst that measures 6.1 x 4.6 x 5.9 cm. No other right ovary abnormality. No right adnexal masses. Left ovary Measurements: 2.9 x 2.0 x 2.8 cm = volume: 8.3 mL. Normal appearance/no adnexal mass. Pulsed Doppler evaluation of both ovaries demonstrates normal low-resistance arterial and venous waveforms. Other findings Small amount of cul-de-sac free fluid presumed physiologic. IMPRESSION: 1. Dominant right ovarian cyst, simple in appearance, measuring 6.1 cm in greatest dimension. 2. No other abnormality.  No ovarian torsion. Electronically Signed   By: Amie Portland M.D.   On: 03/24/2018 22:39    Procedures Procedures (including critical care time)  Medications Ordered in ED Medications  sodium chloride flush (NS) 0.9 % injection 3 mL (3 mLs Intravenous Given 03/24/18 2007)  ketorolac (TORADOL) 30 MG/ML injection 30 mg (30 mg Intravenous Given 03/24/18 2148)  acetaminophen (TYLENOL) tablet 650 mg (650 mg Oral Given 03/24/18 2309)  oxyCODONE-acetaminophen (PERCOCET/ROXICET) 5-325 MG per tablet 1 tablet (1 tablet Oral Given 03/24/18 2309)     Initial Impression / Assessment and Plan / ED Course  I have reviewed the triage vital signs and the nursing notes.  Pertinent labs & imaging results that were available during my care of the patient were reviewed by me and considered in my medical decision making (see chart for details).  Clinical Course as of Mar 24 2331  Mon Mar 24, 2018  2046 Glori Luis): MODERATE [CG]  2047 WBC, UA: 6-10 [CG]  2047 Bacteria, UA(!): RARE [CG]  2144 Glori Luis): MODERATE [CG]  2144 WBC, UA: 6-10 [CG]  2144 Bacteria, UA(!): RARE [CG]  2144 Squamous Epithelial / LPF: 11-20 [CG]  2242  IMPRESSION: 1. Dominant right ovarian cyst, simple in appearance, measuring 6.1 cm in greatest dimension. 2. No other abnormality. No ovarian torsion.   Electronically Signed By: Amie Portland M.D. On: 03/24/2018 22:39  US Pelvis Complete [CG]     Clinical Course User Index [CG] Liberty Handy, PA-C      Highest on ddx is pelvic process like ovarian cyst  vs torsion.  UA symptoms have resolved.  No diarrhea, constipation. No abnormal vaginal bleeding or discharge.  She is not concerned about STDs. No h/o PID, STDs in the past. S/p appendectomy. GI etiology considered less likely.    2330: Pelvic exam +R adenxal fullness and tenderness. No CMT. Scant physiologic discharge with negative whiff test.  Pelvic US confirms 6 cm R ovarian cyst but no torsion.  This explains pt's symptoms.  I discussed results with pt. She is aware size of cyst puts her at risk for complications such as rupture, torsion.  She was educated on symptoms that would suggest torsion and reasons to return to ER.  Pain improved in ER. Labs otherwise unremarkable. UA contaminated but no convincing signs of infection and she has no UTI symptoms, fever, leukocytosis.  Will send urine culture and defer abx here for UTI/Pyelonephritis as this is less likely. I don't see indication for further emergent imaging or labs.  Dc with naproxen, tylenol, zofran and close OBGYN f/u. Pt declined empiric STD testing.  Recommended OTC symptomatic tx for what sounds like uncomplicated URI symptoms, ILI.  Return precautions given.   Final Clinical Impressions(s) / ED Diagnoses   Final diagnoses:  Right ovarian cyst    ED Discharge Orders         Ordered    naproxen (NAPROSYN) 500 MG tablet  2 times daily     03/24/18 2326    ondansetron (ZOFRAN ODT) 4 MG disintegrating tablet  Every 8 hours PRN     03/24/18 2326           Liberty Handy, PA-C 03/24/18 2334    Linwood Dibbles, MD 03/25/18 1506

## 2018-03-24 NOTE — Discharge Instructions (Signed)
You were seen in the ER for right lower pelvic pain.  Ultrasound today showed a 6.1 cm cyst on your right ovary.  This is likely the cause of your pain.  We discussed that given the size of the cyst you are at higher risk for ovarian torsion.  Your ultrasound today was normal and did not show torsion.  Initial treatment of cyst includes anti-inflammatories.  Take naproxen as prescribed.  For more pain control you can take 500 to 1000 mg of acetaminophen every 6-8 hours.  Zofran for nausea.  Follow-up with OB/GYN as soon as you are able to for further discussion of the cyst.  They may recommend further testing or treatment.  Return to the ER for sudden, severe right lower pelvic pain, nausea, vomiting, abdominal swelling, abnormal vaginal bleeding, burning with urination, fevers.

## 2018-03-25 LAB — GC/CHLAMYDIA PROBE AMP (~~LOC~~) NOT AT ARMC
Chlamydia: NEGATIVE
Neisseria Gonorrhea: NEGATIVE

## 2018-04-29 ENCOUNTER — Telehealth: Payer: Self-pay | Admitting: Gastroenterology

## 2018-04-29 NOTE — Telephone Encounter (Signed)
I called patient to make an appointment with Dr Allegra Lai from th ED per DR Allegra Lai. The patient states she does not want to make one at this time.

## 2018-07-23 ENCOUNTER — Encounter: Payer: Self-pay | Admitting: Emergency Medicine

## 2018-07-23 ENCOUNTER — Other Ambulatory Visit: Payer: Self-pay

## 2018-07-23 ENCOUNTER — Ambulatory Visit
Admission: EM | Admit: 2018-07-23 | Discharge: 2018-07-23 | Disposition: A | Discharge status: Home / Self Care | Payer: Medicaid Other | Attending: Family Medicine | Admitting: Family Medicine

## 2018-07-23 DIAGNOSIS — Z3202 Encounter for pregnancy test, result negative: Secondary | ICD-10-CM

## 2018-07-23 LAB — PREGNANCY, URINE: Preg Test, Ur: NEGATIVE

## 2018-07-23 NOTE — ED Triage Notes (Signed)
Patient states that she has not had her period in 7 days. Patient reports vomiting once yesterday.

## 2018-07-23 NOTE — ED Provider Notes (Signed)
[facility]    CSN: [id number] Arrival date & time: 07/23/18  1636     History   Chief Complaint Chief Complaint  Patient presents with  . Possible Pregnancy    HPI Alyssa Evans is a 23 y.o. female.   23 yo female with a c/o missed period for the last 2 months and vomited once yesterday. Denies any nausea, abdominal pain, fevers, chills, dysuria.  No other symptoms.    Possible Pregnancy    Past Medical History:  Diagnosis Date  . Acid reflux   . Anorexia   . Depression   . POTS (postural orthostatic tachycardia syndrome)     There are no active problems to display for this patient.   Past Surgical History:  Procedure Laterality Date  . APPENDECTOMY    . NASAL SEPTUM SURGERY      OB History   No obstetric history on file.      Home Medications    Prior to Admission medications   Medication Sig Start Date End Date Taking? Authorizing Provider  albuterol (PROVENTIL HFA;VENTOLIN HFA) 108 (90 Base) MCG/ACT inhaler Inhale into the lungs. 10/21/14 04/07/16  [provider]  fluticasone (FLONASE) 50 MCG/ACT nasal spray Place 2 sprays into both nostrils daily. Patient taking differently: Place 2 sprays into both nostrils daily as needed.  09/05/15   Hagler, Jami L, PA-C  metroNIDAZOLE (FLAGYL) 500 MG tablet Take 1 tablet (500 mg total) by mouth 2 (two) times daily. 03/07/18   Triplett, Cari B, FNP  naproxen (NAPROSYN) 500 MG tablet Take 1 tablet (500 mg total) by mouth 2 (two) times daily. 03/24/18   Kinnie Feil, PA-C  omeprazole (PRILOSEC) 40 MG capsule Take 1 capsule (40 mg total) by mouth daily for 30 days. 03/07/18 04/06/18  Triplett, Johnette Abraham B, FNP  ondansetron (ZOFRAN ODT) 4 MG disintegrating tablet Take 1 tablet (4 mg total) by mouth every 8 (eight) hours as needed for nausea or vomiting. 03/24/18   Kinnie Feil, PA-C  promethazine (PHENERGAN) 12.5 MG tablet Take 1 tablet (12.5 mg total) by mouth every 6 (six) hours as  needed for nausea or vomiting. 03/07/18   Triplett, Cari B, FNP  sodium chloride 1 g tablet Take 1 tablet (1 g total) by mouth 3 (three) times daily with meals. 12/21/15   End, Harrell Gave, MD  traZODone (DESYREL) 50 MG tablet Take 50 mg by mouth daily as needed.  10/21/14 04/07/16  [provider]    Family History Family History  Problem Relation Age of Onset  . Hyperlipidemia Mother   . Hypertension Mother   . Diabetes gravidarum Mother   . Heart disease Brother 79       ? slight heart attack  . Syncope episode Brother   . Heart attack Maternal Grandmother     Social History Social History   Tobacco Use  . Smoking status: Current Every Day Smoker    Packs/day: 0.50    Years: 3.00    Pack years: 1.50    Types: Cigarettes  . Smokeless tobacco: Never Used  . Tobacco comment: somedays pt smoke pack a day.  Substance Use Topics  . Alcohol use: Yes    Alcohol/week: 1.0 standard drinks    Types: 1 Cans of beer per week    Comment: socially  . Drug use: Yes    Types: Marijuana     Allergies   Patient has no known allergies.   Review of Systems Review of Systems  Physical Exam Triage Vital Signs ED Triage Vitals  Enc Vitals Group     BP 07/23/18 1652 110/68     Pulse Rate 07/23/18 1652 (!) 120     Resp 07/23/18 1652 16     Temp 07/23/18 1652 98.8 F (37.1 C)     Temp Source 07/23/18 1652 Oral     SpO2 07/23/18 1652 99 %     Weight 07/23/18 1648 121 lb (54.9 kg)     Height 07/23/18 1648 5\' 7"  (1.702 m)     Head Circumference --      Peak Flow --      Pain Score 07/23/18 1648 0     Pain Loc --      Pain Edu? --      Excl. in GC? --    No data found.  Updated Vital Signs BP 110/68 (BP Location: Left Arm)   Pulse (!) 120   Temp 98.8 F (37.1 C) (Oral)   Resp 16   Ht 5\' 7"  (1.702 m)   Wt 54.9 kg   LMP 06/04/2018 (Exact Date)   SpO2 99%   BMI 18.95 kg/m   Visual Acuity Right Eye Distance:   Left Eye Distance:   Bilateral Distance:     Right Eye Near:   Left Eye Near:    Bilateral Near:     Physical Exam Vitals signs and nursing note reviewed.  Constitutional:      General: She is not in acute distress.    Appearance: She is not ill-appearing, toxic-appearing or diaphoretic.  Abdominal:     General: There is no distension.     Palpations: Abdomen is soft.  Neurological:     Mental Status: She is alert.      UC Treatments / Results  Labs (all labs ordered are listed, but only abnormal results are displayed) Labs Reviewed  PREGNANCY, URINE    EKG   Radiology No results found.  Procedures Procedures (including critical care time)  Medications Ordered in UC Medications - No data to display  Initial Impression / Assessment and Plan / UC Course  I have reviewed the triage vital signs and the nursing notes.  Pertinent labs & imaging results that were available during my care of the patient were reviewed by me and considered in my medical decision making (see chart for details).      Final Clinical Impressions(s) / UC Diagnoses   Final diagnoses:  Pregnancy examination or test, negative result    ED Prescriptions    None     1. Lab result (negative pregnancy test) and diagnosis reviewed with patient 2. Recommend follow up with PCP  3. Follow-up prn   Controlled Substance Prescriptions [place] Controlled Substance Registry consulted? Not Applicable   Payton Mccallumonty, Georganna Maxson, MD 07/23/18 [phone]

## 2018-09-05 ENCOUNTER — Other Ambulatory Visit: Payer: Self-pay

## 2018-09-05 ENCOUNTER — Ambulatory Visit
Admission: EM | Admit: 2018-09-05 | Discharge: 2018-09-05 | Disposition: A | Discharge status: Home / Self Care | Payer: Medicaid Other | Attending: Emergency Medicine | Admitting: Emergency Medicine

## 2018-09-05 ENCOUNTER — Encounter: Payer: Self-pay | Admitting: Emergency Medicine

## 2018-09-05 DIAGNOSIS — Z20828 Contact with and (suspected) exposure to other viral communicable diseases: Secondary | ICD-10-CM | POA: Insufficient documentation

## 2018-09-05 DIAGNOSIS — R630 Anorexia: Secondary | ICD-10-CM | POA: Insufficient documentation

## 2018-09-05 DIAGNOSIS — H53149 Visual discomfort, unspecified: Secondary | ICD-10-CM | POA: Insufficient documentation

## 2018-09-05 DIAGNOSIS — K219 Gastro-esophageal reflux disease without esophagitis: Secondary | ICD-10-CM | POA: Insufficient documentation

## 2018-09-05 DIAGNOSIS — G44319 Acute post-traumatic headache, not intractable: Secondary | ICD-10-CM | POA: Insufficient documentation

## 2018-09-05 DIAGNOSIS — R110 Nausea: Secondary | ICD-10-CM | POA: Insufficient documentation

## 2018-09-05 DIAGNOSIS — F17210 Nicotine dependence, cigarettes, uncomplicated: Secondary | ICD-10-CM | POA: Insufficient documentation

## 2018-09-05 MED ORDER — ONDANSETRON 8 MG PO TBDP: 8.0000 mg | ORAL_TABLET | Freq: Two times a day (BID) | ORAL | 0 refills | Status: DC

## 2018-09-05 MED ORDER — BUTALBITAL-APAP-CAFFEINE 50-325-40 MG PO TABS: 1.0000 | ORAL_TABLET | Freq: Four times a day (QID) | ORAL | 0 refills | Status: DC | PRN

## 2018-09-05 NOTE — ED Triage Notes (Signed)
Patient states she hit her head 3 days ago and she has been dizzy, nauseated and had a headache for the past 2 days

## 2018-09-05 NOTE — Discharge Instructions (Signed)
If your symptoms worsen or change in any way go immediately to the emergency room.  Recommend not working for the next 2 to 3 days.

## 2018-09-05 NOTE — ED Provider Notes (Signed)
[facility]    CSN: [id number] Arrival date & time: 09/05/18  1309      History   Chief Complaint Chief Complaint  Patient presents with  . Headache    HPI Alyssa Evans is a 23 y.o. female.   HPI  23 year old female presents with headache nausea and photophobia.  She also complained of a 20-minute numbness of her left upper extremity and chest pain over her left chest.  She relates that 4 days ago she inadvertently hit the corner of her car door against the left temporal area of her head.  She states that it hurt at first but did not have any bruising or any bleeding.  She states for 2 days she is doing she was doing fine in the last 2 days she has been noticing the above symptoms as well as occasional dizziness.  She complains that her right eye is slightly blurred.  She does not have any loss of consciousness.  She has had some nausea but denies any vomiting.  She had diarrhea yesterday and this has since stopped.  Has had no syncope.  She has a history of migraines in the past but this has stopped after she had sinus surgery.         Past Medical History:  Diagnosis Date  . Acid reflux   . Anorexia   . Depression   . POTS (postural orthostatic tachycardia syndrome)     There are no active problems to display for this patient.   Past Surgical History:  Procedure Laterality Date  . APPENDECTOMY    . NASAL SEPTUM SURGERY    . OVARIAN CYST REMOVAL      OB History   No obstetric history on file.      Home Medications    Prior to Admission medications   Medication Sig Start Date End Date Taking? Authorizing Provider  butalbital-acetaminophen-caffeine (FIORICET) 50-325-40 MG tablet Take 1 tablet by mouth every 6 (six) hours as needed for headache. 09/05/18 09/05/19  Lutricia Feiloemer, Lydia Toren P, PA-C  naproxen (NAPROSYN) 500 MG tablet Take 1 tablet (500 mg total) by mouth 2 (two) times daily. 03/24/18   Liberty HandyGibbons, Claudia J, PA-C  omeprazole (PRILOSEC) 40  MG capsule Take 1 capsule (40 mg total) by mouth daily for 30 days. 03/07/18 04/06/18  Triplett, Rulon Eisenmengerari B, FNP  ondansetron (ZOFRAN ODT) 8 MG disintegrating tablet Take 1 tablet (8 mg total) by mouth 2 (two) times daily. 09/05/18   Lutricia Feiloemer, Altheria Shadoan P, PA-C  traZODone (DESYREL) 50 MG tablet Take 50 mg by mouth daily as needed.  10/21/14 04/07/16  [provider]  albuterol (PROVENTIL HFA;VENTOLIN HFA) 108 (90 Base) MCG/ACT inhaler Inhale into the lungs. 10/21/14 09/05/18  [provider]  fluticasone (FLONASE) 50 MCG/ACT nasal spray Place 2 sprays into both nostrils daily. Patient taking differently: Place 2 sprays into both nostrils daily as needed.  09/05/15 09/05/18  Hagler, Jami L, PA-C  promethazine (PHENERGAN) 12.5 MG tablet Take 1 tablet (12.5 mg total) by mouth every 6 (six) hours as needed for nausea or vomiting. 03/07/18 09/05/18  Chinita Pesterriplett, Cari B, FNP    Family History Family History  Problem Relation Age of Onset  . Hyperlipidemia Mother   . Hypertension Mother   . Diabetes gravidarum Mother   . Heart disease Brother 17       ? slight heart attack  . Syncope episode Brother   . Heart attack Maternal Grandmother     Social History Social History  Tobacco Use  . Smoking status: Current Every Day Smoker    Packs/day: 0.50    Years: 3.00    Pack years: 1.50    Types: Cigarettes  . Smokeless tobacco: Never Used  . Tobacco comment: somedays pt smoke pack a day.  Substance Use Topics  . Alcohol use: Yes    Alcohol/week: 1.0 standard drinks    Types: 1 Cans of beer per week    Comment: socially  . Drug use: Yes    Types: Marijuana     Allergies   Patient has no known allergies.   Review of Systems Review of Systems  Constitutional: Positive for activity change and fatigue. Negative for appetite change, chills, diaphoresis and fever.  HENT: Positive for congestion.   Eyes: Positive for photophobia and visual disturbance.  Neurological: Positive for  dizziness, numbness and headaches. Negative for tremors, seizures, syncope, facial asymmetry, speech difficulty, weakness and light-headedness.  All other systems reviewed and are negative.    Physical Exam Triage Vital Signs ED Triage Vitals  Enc Vitals Group     BP 09/05/18 1327 127/87     Pulse Rate 09/05/18 1327 78     Resp 09/05/18 1327 16     Temp 09/05/18 1327 98.4 F (36.9 C)     Temp Source 09/05/18 1327 Oral     SpO2 09/05/18 1327 100 %     Weight 09/05/18 1327 118 lb (53.5 kg)     Height 09/05/18 1327 5\' 7"  (1.702 m)     Head Circumference --      Peak Flow --      Pain Score 09/05/18 1325 8     Pain Loc --      Pain Edu? --      Excl. in [place]? --    No data found.  Updated Vital Signs BP 127/87 (BP Location: Left Arm)   Pulse 78   Temp 98.4 F (36.9 C) (Oral)   Resp 16   Ht 5\' 7"  (1.702 m)   Wt 118 lb (53.5 kg)   LMP 08/29/2018 (Exact Date)   SpO2 100%   BMI 18.48 kg/m   Visual Acuity Right Eye Distance: 20/50 Left Eye Distance: 20/40 Bilateral Distance:    Right Eye Near:   Left Eye Near:    Bilateral Near:     Physical Exam Vitals signs and nursing note reviewed.  Constitutional:      General: She is not in acute distress.    Appearance: She is well-developed and normal weight. She is not ill-appearing, toxic-appearing or diaphoretic.  HENT:     Head: Normocephalic and atraumatic.     Mouth/Throat:     Mouth: Mucous membranes are moist.  Eyes:     General: No visual field deficit.    Extraocular Movements: Extraocular movements intact.     Right eye: Normal extraocular motion and no nystagmus.     Left eye: Normal extraocular motion and no nystagmus.     Pupils: Pupils are equal, round, and reactive to light.     Right eye: Pupil is round and reactive.     Left eye: Pupil is round and reactive.  Neck:     Musculoskeletal: Normal range of motion and neck supple.  Pulmonary:     Effort: Pulmonary effort is normal.     Breath sounds:  Normal breath sounds.  Musculoskeletal: Normal range of motion.     Comments: She has tenderness to palpation over the anterior shoulder.  It  is uncomfortable to move into AB duction.  There is no hip esthesia to light touch.  Skin:    General: Skin is warm and dry.  Neurological:     Mental Status: She is alert and oriented to person, place, and time.     GCS: GCS eye subscore is 4. GCS verbal subscore is 5. GCS motor subscore is 6.     Cranial Nerves: No cranial nerve deficit, dysarthria or facial asymmetry.     Sensory: No sensory deficit.     Motor: No weakness.     Coordination: Romberg sign negative. Coordination normal.     Gait: Gait normal.     Deep Tendon Reflexes: Reflexes normal.  Psychiatric:        Mood and Affect: Mood normal.        Behavior: Behavior normal.      UC Treatments / Results  Labs (all labs ordered are listed, but only abnormal results are displayed) Labs Reviewed  NOVEL CORONAVIRUS, NAA (HOSP ORDER, SEND-OUT TO REF LAB; TAT 18-24 HRS)    EKG   Radiology No results found.  Procedures Procedures (including critical care time)  Medications Ordered in UC Medications - No data to display  Initial Impression / Assessment and Plan / UC Course  I have reviewed the triage vital signs and the nursing notes.  Pertinent labs & imaging results that were available during my care of the patient were reviewed by me and considered in my medical decision making (see chart for details).   74-year-old female who sustained a mild head injury 4 days ago who for the last 2 days has been having increasing headache some dizziness nausea but without vomiting.  She has had no syncopal episodes.  Neurological examination today is normal with no red flag signs present.  Advised her with medications for the nausea as well as Fioricet for headaches.  Told her to use brain rest for the next couple of days take it easy did not work.  I have also told her that if she has any  worsening or changes in her symptoms she should go immediately to the emergency room.   Final Clinical Impressions(s) / UC Diagnoses   Final diagnoses:  Acute post-traumatic headache, not intractable     Discharge Instructions     If your symptoms worsen or change in any way go immediately to the emergency room.  Recommend not working for the next 2 to 3 days.    ED Prescriptions    Medication Sig Dispense Auth. Provider   ondansetron (ZOFRAN ODT) 8 MG disintegrating tablet Take 1 tablet (8 mg total) by mouth 2 (two) times daily. 6 tablet Lutricia Feil, PA-C   butalbital-acetaminophen-caffeine (FIORICET) 50-325-40 MG tablet Take 1 tablet by mouth every 6 (six) hours as needed for headache. 20 tablet Lutricia Feil, PA-C     Controlled Substance Prescriptions [place] Controlled Substance Registry consulted? PMP aware was consulted has no recent opioid prescriptions since March.   Lutricia Feil, PA-C 09/05/18 1432

## 2018-09-06 LAB — NOVEL CORONAVIRUS, NAA (HOSP ORDER, SEND-OUT TO REF LAB; TAT 18-24 HRS): SARS-CoV-2, NAA: NOT DETECTED

## 2018-09-30 ENCOUNTER — Encounter: Payer: Self-pay | Admitting: Emergency Medicine

## 2018-09-30 ENCOUNTER — Other Ambulatory Visit: Payer: Self-pay

## 2018-09-30 ENCOUNTER — Ambulatory Visit
Admission: EM | Admit: 2018-09-30 | Discharge: 2018-09-30 | Disposition: A | Discharge status: Home / Self Care | Payer: Medicaid Other | Attending: Family Medicine | Admitting: Family Medicine

## 2018-09-30 DIAGNOSIS — Z3490 Encounter for supervision of normal pregnancy, unspecified, unspecified trimester: Secondary | ICD-10-CM

## 2018-09-30 DIAGNOSIS — R110 Nausea: Secondary | ICD-10-CM

## 2018-09-30 DIAGNOSIS — Z3201 Encounter for pregnancy test, result positive: Secondary | ICD-10-CM

## 2018-09-30 LAB — PREGNANCY, URINE: Preg Test, Ur: POSITIVE — AB

## 2018-09-30 MED ORDER — DOXYLAMINE-PYRIDOXINE 10-10 MG PO TBEC: DELAYED_RELEASE_TABLET | ORAL | 3 refills | Status: DC

## 2018-09-30 MED ORDER — PRENATAL VITAMINS 28-0.8 MG PO TABS: 1.0000 | ORAL_TABLET | Freq: Every day | ORAL | 3 refills | Status: DC

## 2018-09-30 NOTE — ED Triage Notes (Signed)
Patient states she took 3 pregnancy tests this morning and they were positive. She states she had her period 2 weeks and it was lighter than normal. Patient reports cramping, back pain and nausea x 1 week.

## 2018-09-30 NOTE — Discharge Instructions (Addendum)
Call OB for an appt - [facility] - [phone], [facility] - [phone]  Medications as prescribed.  Take care  Dr. Lacinda Axon

## 2018-09-30 NOTE — ED Provider Notes (Addendum)
[facility]    CSN: [id number] Arrival date & time: 09/30/18  1809      History   Chief Complaint Chief Complaint  Patient presents with  . Possible Pregnancy   HPI  23 year old female presents with the above complaints.  Patient reports that she has irregular menstrual cycles.  She states that she has recently been feeling lightheaded and experiencing nausea.  She has underlying pots but states that she has not had issues in quite some time.  She became concerned that she may be pregnant.  She took 3 home pregnancy tests which were positive.  Patient states that she had some spotting between August 28 and September 3.  She states that this was different from her typical periods.  She believes that she is pregnant.  She reports some lower abdominal cramping.  She has not had any recent vaginal bleeding other than what is noted above.  She rates her pain as 5/10 in severity.  Denies fever.  No vomiting.  No other reported symptoms.  No other complaints or concerns at this time.  PMH, Surgical Hx, Family Hx, Social History reviewed and updated as below.  Past Medical History:  Diagnosis Date  . Acid reflux   . Anorexia   . Depression   . POTS (postural orthostatic tachycardia syndrome)    Past Surgical History:  Procedure Laterality Date  . APPENDECTOMY    . NASAL SEPTUM SURGERY    . OVARIAN CYST REMOVAL     OB History   No obstetric history on file.    Home Medications    Prior to Admission medications   Medication Sig Start Date End Date Taking? Authorizing Provider  Doxylamine-Pyridoxine 10-10 MG TBEC 2 tablets at bedtime on day 1 and 2; if symptoms persist, take 1 tablet in morning and 2 tablets at bedtime on day 3; if symptoms persist, may increase to 1 tablet in morning, 1 tablet mid-afternoon, and 2 tablets at bedtime 09/30/18   Hildagard Sobecki G, DO  omeprazole (PRILOSEC) 40 MG capsule Take 1 capsule (40 mg total) by mouth daily for 30 days. 03/07/18 04/06/18   Triplett, Kasandra Knudsen, FNP  Prenatal Vit-Fe Fumarate-FA (PRENATAL VITAMINS) 28-0.8 MG TABS Take 1 tablet by mouth daily. 09/30/18   Tommie Sams, DO  traZODone (DESYREL) 50 MG tablet Take 50 mg by mouth daily as needed.  10/21/14 04/07/16  [provider]  albuterol (PROVENTIL HFA;VENTOLIN HFA) 108 (90 Base) MCG/ACT inhaler Inhale into the lungs. 10/21/14 09/05/18  [provider]  fluticasone (FLONASE) 50 MCG/ACT nasal spray Place 2 sprays into both nostrils daily. Patient taking differently: Place 2 sprays into both nostrils daily as needed.  09/05/15 09/05/18  Hagler, Jami L, PA-C  promethazine (PHENERGAN) 12.5 MG tablet Take 1 tablet (12.5 mg total) by mouth every 6 (six) hours as needed for nausea or vomiting. 03/07/18 09/05/18  Chinita Pester, FNP    Family History Family History  Problem Relation Age of Onset  . Hyperlipidemia Mother   . Hypertension Mother   . Diabetes gravidarum Mother   . Heart disease Brother 17       ? slight heart attack  . Syncope episode Brother   . Heart attack Maternal Grandmother     Social History Social History   Tobacco Use  . Smoking status: Current Every Day Smoker    Packs/day: 0.50    Years: 3.00    Pack years: 1.50    Types: Cigarettes  . Smokeless  tobacco: Never Used  . Tobacco comment: somedays pt smoke pack a day.  Substance Use Topics  . Alcohol use: Yes    Alcohol/week: 1.0 standard drinks    Types: 1 Cans of beer per week    Comment: socially  . Drug use: Yes    Types: Marijuana     Allergies   Patient has no known allergies.   Review of Systems Review of Systems  Gastrointestinal: Positive for nausea.  Genitourinary:       Abdominal cramping.  Neurological: Positive for light-headedness.   Physical Exam Triage Vital Signs ED Triage Vitals  Enc Vitals Group     BP 09/30/18 1826 121/90     Pulse Rate 09/30/18 1826 (!) 116     Resp 09/30/18 1826 18     Temp 09/30/18 1826 98.4 F (36.9 C)     Temp  Source 09/30/18 1826 Oral     SpO2 09/30/18 1826 100 %     Weight 09/30/18 1823 121 lb (54.9 kg)     Height 09/30/18 1823 5\' 7"  (1.702 m)     Head Circumference --      Peak Flow --      Pain Score 09/30/18 1822 5     Pain Loc --      Pain Edu? --      Excl. in [place]? --    Updated Vital Signs BP 121/90 (BP Location: Right Arm)   Pulse (!) 116   Temp 98.4 F (36.9 C) (Oral)   Resp 18   Ht 5\' 7"  (1.702 m)   Wt 54.9 kg   LMP 09/05/2018 (Exact Date)   SpO2 100%   BMI 18.95 kg/m   Visual Acuity Right Eye Distance:   Left Eye Distance:   Bilateral Distance:    Right Eye Near:   Left Eye Near:    Bilateral Near:     Physical Exam Vitals signs and nursing note reviewed.  Constitutional:      General: She is not in acute distress.    Appearance: Normal appearance. She is not ill-appearing.  HENT:     Head: Normocephalic and atraumatic.  Eyes:     General:        Right eye: No discharge.        Left eye: No discharge.     Conjunctiva/sclera: Conjunctivae normal.  Cardiovascular:     Rate and Rhythm: Regular rhythm. Tachycardia present.     Heart sounds: No murmur.  Pulmonary:     Effort: Pulmonary effort is normal.     Breath sounds: Normal breath sounds. No wheezing, rhonchi or rales.  Abdominal:     General: Abdomen is flat. There is no distension.     Palpations: Abdomen is soft.     Tenderness: There is no abdominal tenderness. There is no guarding or rebound.  Neurological:     Mental Status: She is alert.  Psychiatric:        Behavior: Behavior normal.     Comments: Flat affect.    UC Treatments / Results  Labs (all labs ordered are listed, but only abnormal results are displayed) Labs Reviewed  PREGNANCY, URINE - Abnormal; Notable for the following components:      Result Value   Preg Test, Ur POSITIVE (*)    All other components within normal limits    EKG   Radiology No results found.  Procedures Procedures (including critical care time)   Medications Ordered in UC Medications - No  data to display  Initial Impression / Assessment and Plan / UC Course  I have reviewed the triage vital signs and the nursing notes.  Pertinent labs & imaging results that were available during my care of the patient were reviewed by me and considered in my medical decision making (see chart for details).    23 year old female presents with lightheadedness, nausea, and concern for pregnancy.  Pregnancy test positive here.  Patient has no evidence of miscarriage or ectopic at this time.  Placing on prenatal vitamins and diglegis.  Information given regarding local OB/GYN's.  Advised to go to the ER immediately if she develops worsening abdominal pain, vaginal bleeding, or other alarming symptoms.  Final Clinical Impressions(s) / UC Diagnoses   Final diagnoses:  Pregnancy, unspecified gestational age  Nausea     Discharge Instructions     Call OB for an appt - [facility] - [phone], [facility] - [phone]  Medications as prescribed.  Take care  Dr. Lacinda Axon     ED Prescriptions    Medication Sig Dispense Auth. Provider   Prenatal Vit-Fe Fumarate-FA (PRENATAL VITAMINS) 28-0.8 MG TABS Take 1 tablet by mouth daily. 90 tablet Lada Fulbright G, DO   Doxylamine-Pyridoxine 10-10 MG TBEC 2 tablets at bedtime on day 1 and 2; if symptoms persist, take 1 tablet in morning and 2 tablets at bedtime on day 3; if symptoms persist, may increase to 1 tablet in morning, 1 tablet mid-afternoon, and 2 tablets at bedtime 60 tablet Coral Spikes, DO     PDMP not reviewed this encounter.   Coral Spikes, DO 09/30/18 1934    Coral Spikes, DO 09/30/18 1935

## 2018-10-30 ENCOUNTER — Other Ambulatory Visit: Payer: Medicaid Other

## 2018-12-02 ENCOUNTER — Encounter: Payer: Medicaid Other | Admitting: Obstetrics and Gynecology

## 2019-06-25 ENCOUNTER — Encounter: Payer: Self-pay | Admitting: Emergency Medicine

## 2019-06-25 ENCOUNTER — Ambulatory Visit
Admission: EM | Admit: 2019-06-25 | Discharge: 2019-06-25 | Disposition: A | Discharge status: Home / Self Care | Payer: Medicaid Other

## 2019-06-25 ENCOUNTER — Other Ambulatory Visit: Payer: Self-pay

## 2019-06-25 DIAGNOSIS — N949 Unspecified condition associated with female genital organs and menstrual cycle: Secondary | ICD-10-CM | POA: Diagnosis not present

## 2019-06-25 NOTE — ED Triage Notes (Signed)
Pt c/o vaginal tear. She is 9 days postpartum. She states she was told that she did not tear during delivery and she should heal nicely. She states she looked at her self and states she is torn. Pt states she called her PCP and was told to come to UC. She states she also called her OB and was told that if they thought it was ok during delivery than it is ok now.

## 2019-06-25 NOTE — ED Provider Notes (Signed)
[facility] ____________________________________________  Time seen: Approximately 2:18 PM  I have reviewed the triage vital signs and the nursing notes.   HISTORY  Chief Complaint Vaginal Pain  HPI Alyssa Evans is a 24 y.o. female who is gravida 1 para 1 who is 9 days postpartum, presenting for concern of vaginal tear from delivery.  Patient reports on June 8 she delivered a 7 pound healthy boy, and reports the initial week postpartum was normal with vaginal bleeding and what she expected.  However reports on Monday she was looking at her vagina, and states she saw an area that she was worried was a deep vaginal tear.  States postpartum at hospital she was informed she did not have a vaginal tear.  States that she has been using the peribottle at home.  States she does have some discomfort in the skin with urinating but no dysuria, and reports peribottle resolves this.  States the area is sore.  States some more pain on the left vagina and then on the right.  Denies any further intravaginal pain or abdominal pain.  States she still has some intermittent abdominal cramping that is getting better, not worse.  She has had continued vaginal bleeding since delivery.  Does report some intermittent odorous vaginal discharge mixed with bleeding.  Has not resume sexual activity or inserted anything into the vagina.  Denies any vomiting, diarrhea, constipation or fevers.  She is nursing. States overall otherwise she is feeling well.  OB/GYN: [facility]  Past Medical History:  Diagnosis Date  . Acid reflux   . Anorexia   . Depression   . POTS (postural orthostatic tachycardia syndrome)     There are no problems to display for this patient.   Past Surgical History:  Procedure Laterality Date  . APPENDECTOMY    . NASAL SEPTUM SURGERY    . OVARIAN CYST REMOVAL       No current facility-administered medications for this encounter.  Current Outpatient Medications:  .   Prenatal Vit-Fe Fumarate-FA (PRENATAL VITAMINS) 28-0.8 MG TABS, Take 1 tablet by mouth daily., Disp: 90 tablet, Rfl: 3 .  Doxylamine-Pyridoxine 10-10 MG TBEC, 2 tablets at bedtime on day 1 and 2; if symptoms persist, take 1 tablet in morning and 2 tablets at bedtime on day 3; if symptoms persist, may increase to 1 tablet in morning, 1 tablet mid-afternoon, and 2 tablets at bedtime, Disp: 60 tablet, Rfl: 3 .  omeprazole (PRILOSEC) 40 MG capsule, Take 1 capsule (40 mg total) by mouth daily for 30 days., Disp: 30 capsule, Rfl: 0 .  traZODone (DESYREL) 50 MG tablet, Take 50 mg by mouth daily as needed. , Disp: , Rfl:   Allergies Patient has no known allergies.  Family History  Problem Relation Age of Onset  . Hyperlipidemia Mother   . Hypertension Mother   . Diabetes gravidarum Mother   . Heart disease Brother 17       ? slight heart attack  . Syncope episode Brother   . Heart attack Maternal Grandmother     Social History Social History   Tobacco Use  . Smoking status: Current Every Day Smoker    Packs/day: 0.50    Years: 3.00    Pack years: 1.50    Types: Cigarettes  . Smokeless tobacco: Never Used  . Tobacco comment: somedays pt smoke pack a day.  Vaping Use  . Vaping Use: Some days  Substance Use Topics  . Alcohol use: Yes    Alcohol/week:  1.0 standard drink    Types: 1 Cans of beer per week    Comment: socially  . Drug use: Not Currently    Types: Marijuana    Review of Systems Constitutional: No fever Cardiovascular: Denies chest pain. Respiratory: Denies shortness of breath. Gastrointestinal: No abdominal pain.  No nausea, no vomiting.  No diarrhea.  No constipation. Genitourinary: As above. Musculoskeletal: Negative for back pain. Skin: Negative for rash.  ____________________________________________   PHYSICAL EXAM:  VITAL SIGNS: ED Triage Vitals  Enc Vitals Group     BP 06/25/19 1325 117/90     Pulse Rate 06/25/19 1325 99     Resp 06/25/19 1325 18      Temp 06/25/19 1325 98.7 F (37.1 C)     Temp Source 06/25/19 1325 Oral     SpO2 06/25/19 1325 98 %     Weight 06/25/19 1323 121 lb 0.5 oz (54.9 kg)     Height 06/25/19 1323 5\' 7"  (1.702 m)     Head Circumference --      Peak Flow --      Pain Score 06/25/19 1322 3     Pain Loc --      Pain Edu? --      Excl. in [place]? --     Constitutional: Alert and oriented. Well appearing and in no acute distress. Eyes: Conjunctivae are normal.  ENT      Head: Normocephalic and atraumatic. Cardiovascular: Normal rate, regular rhythm. Grossly normal heart sounds.  Good peripheral circulation. Respiratory: Normal respiratory effort without tachypnea nor retractions. Breath sounds are clear and equal bilaterally. No wheezes, rales, rhonchi. Gastrointestinal: Normal Bowel sounds.  Minimal diffuse abdominal tenderness, no point tenderness, no guarding.  No CVA tenderness. Pelvic: External only.  Exam completed with [place] CMA at bedside as chaperone. External: Patient with mild vaginal bleeding present in vaginal canal, mild vaginal tissue edema, no erythema, no other discharge visible, no visible labial or perineal tear, no induration noted. Musculoskeletal: Steady gait Neurologic:  Normal speech and language. No gross focal neurologic deficits are appreciated. Speech is normal. No gait instability.  Skin:  Skin is warm, dry and intact. No rash noted. Psychiatric: Mood and affect are normal. Speech and behavior are normal. Patient exhibits appropriate insight and judgment   ___________________________________________   LABS (all labs ordered are listed, but only abnormal results are displayed)  Labs Reviewed - No data to display ____________________________________________   PROCEDURES   INITIAL IMPRESSION / ASSESSMENT AND PLAN / ED COURSE  Pertinent labs & imaging results that were available during my care of the patient were reviewed by me and considered in my medical decision making (see  chart for details).  Very well-appearing patient.  No acute distress.  9 days postpartum with continued vaginal bleeding presenting for concern of vaginal tear.  Conversation had with patient regarding need to follow-up with OB/GYN for further evaluation and management of this.  External pelvic exam was completed, no obvious vaginal tear noted, did note vaginal tissue edema mildly.  Patient appears well, afebrile, denies worsening of pain, patient appears stable.  Patient recommended to evaluate with OB/GYN today for further management and internal exam to ensure no tear noted and to exclude infection.  Patient states she is comfortable with this and will call at this time and follow recommendations.  Counseled for any fever, increasing pain or worsening complaints proceed directly to emergency room.  Patient states that she will go to her OB/GYN office today.  Discussed follow up and return parameters including no resolution or any worsening concerns. Patient verbalized understanding and agreed to plan.   ____________________________________________   FINAL CLINICAL IMPRESSION(S) / ED DIAGNOSES  Final diagnoses:  Vaginal discomfort  Postpartum care following vaginal delivery     ED Discharge Orders    None       Note: This dictation was prepared with Dragon dictation along with smaller phrase technology. Any transcriptional errors that result from this process are unintentional.         Renford Dills, NP 06/25/19 1436

## 2019-06-25 NOTE — Discharge Instructions (Signed)
Follow-up with your OB/GYN as soon as possible.  As discussed, this is very important.  You need to be seen today or tomorrow.  For any increased vaginal bleeding, vaginal pain, abdominal pain, fevers, or worsening complaints proceed directly to emergency room.

## 2019-09-08 ENCOUNTER — Ambulatory Visit
Admission: EM | Admit: 2019-09-08 | Discharge: 2019-09-08 | Disposition: A | Discharge status: Home / Self Care | Payer: Medicaid Other | Attending: Internal Medicine | Admitting: Internal Medicine

## 2019-09-08 ENCOUNTER — Other Ambulatory Visit: Payer: Self-pay

## 2019-09-08 ENCOUNTER — Encounter: Payer: Self-pay | Admitting: Emergency Medicine

## 2019-09-08 DIAGNOSIS — K219 Gastro-esophageal reflux disease without esophagitis: Secondary | ICD-10-CM | POA: Diagnosis not present

## 2019-09-08 DIAGNOSIS — Z79899 Other long term (current) drug therapy: Secondary | ICD-10-CM | POA: Insufficient documentation

## 2019-09-08 DIAGNOSIS — Z87891 Personal history of nicotine dependence: Secondary | ICD-10-CM | POA: Insufficient documentation

## 2019-09-08 DIAGNOSIS — M2550 Pain in unspecified joint: Secondary | ICD-10-CM | POA: Diagnosis not present

## 2019-09-08 DIAGNOSIS — Z20822 Contact with and (suspected) exposure to covid-19: Secondary | ICD-10-CM | POA: Insufficient documentation

## 2019-09-08 DIAGNOSIS — B349 Viral infection, unspecified: Secondary | ICD-10-CM | POA: Insufficient documentation

## 2019-09-08 DIAGNOSIS — R05 Cough: Secondary | ICD-10-CM | POA: Diagnosis not present

## 2019-09-08 DIAGNOSIS — J029 Acute pharyngitis, unspecified: Secondary | ICD-10-CM | POA: Diagnosis present

## 2019-09-08 DIAGNOSIS — F329 Major depressive disorder, single episode, unspecified: Secondary | ICD-10-CM | POA: Insufficient documentation

## 2019-09-08 DIAGNOSIS — R519 Headache, unspecified: Secondary | ICD-10-CM | POA: Diagnosis not present

## 2019-09-08 DIAGNOSIS — M791 Myalgia, unspecified site: Secondary | ICD-10-CM | POA: Insufficient documentation

## 2019-09-08 LAB — SARS CORONAVIRUS 2 (TAT 6-24 HRS): SARS Coronavirus 2: NEGATIVE

## 2019-09-08 LAB — GROUP A STREP BY PCR: Group A Strep by PCR: NOT DETECTED

## 2019-09-08 MED ORDER — BENZONATATE 100 MG PO CAPS: 100.0000 mg | ORAL_CAPSULE | Freq: Three times a day (TID) | ORAL | 0 refills | Status: DC

## 2019-09-08 NOTE — ED Provider Notes (Signed)
[facility]    CSN: [id number] Arrival date & time: 09/08/19  1202      History   Chief Complaint Chief Complaint  Patient presents with  . Sore Throat  . Headache    HPI Alyssa Evans is a 24 y.o. female comes to urgent care with a 1 day history of sore throat, generalized body aches, nonproductive cough and increasing fatigue.  Patient was exposed to COVID-19 positive individuals at work.  She has myalgias and some joint pain.  No nausea or vomiting.  No diarrhea.Marland Kitchen   HPI  Past Medical History:  Diagnosis Date  . Acid reflux   . Anorexia   . Depression   . POTS (postural orthostatic tachycardia syndrome)     There are no problems to display for this patient.   Past Surgical History:  Procedure Laterality Date  . APPENDECTOMY    . NASAL SEPTUM SURGERY    . OVARIAN CYST REMOVAL      OB History   No obstetric history on file.      Home Medications    Prior to Admission medications   Medication Sig Start Date End Date Taking? Authorizing Provider  Prenatal Vit-Fe Fumarate-FA (PRENATAL VITAMINS) 28-0.8 MG TABS Take 1 tablet by mouth daily. 09/30/18  Yes Cook, Jayce G, DO  benzonatate (TESSALON) 100 MG capsule Take 1 capsule (100 mg total) by mouth every 8 (eight) hours. 09/08/19   Merrilee Jansky, MD  omeprazole (PRILOSEC) 40 MG capsule Take 1 capsule (40 mg total) by mouth daily for 30 days. 03/07/18 04/06/18  Triplett, Rulon Eisenmenger B, FNP  traZODone (DESYREL) 50 MG tablet Take 50 mg by mouth daily as needed.  10/21/14 04/07/16  [provider]  albuterol (PROVENTIL HFA;VENTOLIN HFA) 108 (90 Base) MCG/ACT inhaler Inhale into the lungs. 10/21/14 09/05/18  [provider]  fluticasone (FLONASE) 50 MCG/ACT nasal spray Place 2 sprays into both nostrils daily. Patient taking differently: Place 2 sprays into both nostrils daily as needed.  09/05/15 09/05/18  Hagler, Jami L, PA-C  promethazine (PHENERGAN) 12.5 MG tablet Take 1 tablet (12.5 mg  total) by mouth every 6 (six) hours as needed for nausea or vomiting. 03/07/18 09/05/18  Chinita Pester, FNP    Family History Family History  Problem Relation Age of Onset  . Hyperlipidemia Mother   . Hypertension Mother   . Diabetes gravidarum Mother   . Heart disease Brother 17       ? slight heart attack  . Syncope episode Brother   . Heart attack Maternal Grandmother     Social History Social History   Tobacco Use  . Smoking status: Former Smoker    Packs/day: 0.50    Years: 3.00    Pack years: 1.50    Types: Cigarettes  . Smokeless tobacco: Never Used  . Tobacco comment: somedays pt smoke pack a day.  Vaping Use  . Vaping Use: Some days  Substance Use Topics  . Alcohol use: Yes    Alcohol/week: 1.0 standard drink    Types: 1 Cans of beer per week    Comment: socially  . Drug use: Not Currently    Types: Marijuana     Allergies   Patient has no known allergies.   Review of Systems Review of Systems  Gastrointestinal: Negative.   Genitourinary: Negative.   Neurological: Positive for headaches. Negative for dizziness, light-headedness and numbness.     Physical Exam Triage Vital Signs ED Triage Vitals  Enc Vitals  Group     BP 09/08/19 1235 109/76     Pulse Rate 09/08/19 1235 74     Resp 09/08/19 1235 18     Temp 09/08/19 1235 98 F (36.7 C)     Temp Source 09/08/19 1235 Oral     SpO2 09/08/19 1235 100 %     Weight 09/08/19 1233 125 lb (56.7 kg)     Height 09/08/19 1233 5\' 7"  (1.702 m)     Head Circumference --      Peak Flow --      Pain Score 09/08/19 1233 5     Pain Loc --      Pain Edu? --      Excl. in [place]? --    No data found.  Updated Vital Signs BP 109/76 (BP Location: Right Arm)   Pulse 74   Temp 98 F (36.7 C) (Oral)   Resp 18   Ht 5\' 7"  (1.702 m)   Wt 56.7 kg   LMP 08/25/2019   SpO2 100%   BMI 19.58 kg/m   Visual Acuity Right Eye Distance:   Left Eye Distance:   Bilateral Distance:    Right Eye Near:   Left Eye  Near:    Bilateral Near:     Physical Exam Vitals and nursing note reviewed.  Constitutional:      General: She is not in acute distress.    Appearance: She is not ill-appearing.  HENT:     Right Ear: Tympanic membrane normal.     Left Ear: Tympanic membrane normal.     Mouth/Throat:     Tonsils: No tonsillar exudate or tonsillar abscesses. 0 on the right.  Cardiovascular:     Rate and Rhythm: Normal rate and regular rhythm.  Abdominal:     General: Bowel sounds are normal. There is no distension.     Palpations: Abdomen is soft.     Tenderness: There is no abdominal tenderness. There is no guarding.  Musculoskeletal:     Cervical back: Normal range of motion.  Lymphadenopathy:     Cervical: No cervical adenopathy.  Neurological:     Mental Status: She is alert.      UC Treatments / Results  Labs (all labs ordered are listed, but only abnormal results are displayed) Labs Reviewed  GROUP A STREP BY PCR  SARS CORONAVIRUS 2 (TAT 6-24 HRS)    EKG   Radiology No results found.  Procedures Procedures (including critical care time)  Medications Ordered in UC Medications - No data to display  Initial Impression / Assessment and Plan / UC Course  I have reviewed the triage vital signs and the nursing notes.  Pertinent labs & imaging results that were available during my care of the patient were reviewed by me and considered in my medical decision making (see chart for details).     1.  Acute viral syndrome: This is likely COVID-19 infection Covid PCR test has been sent Patient is advised to quarantine until COVID-19 test results are available Tessalon Perles as needed for cough Return precautions given. Final Clinical Impressions(s) / UC Diagnoses   Final diagnoses:  Acute viral syndrome     Discharge Instructions     Please quarantine with your family until COVID-19 test results are available If you have any worsening symptoms please return to the  urgent care Increase oral fluid intake    ED Prescriptions    Medication Sig Dispense Auth. Provider   benzonatate (TESSALON) 100  MG capsule Take 1 capsule (100 mg total) by mouth every 8 (eight) hours. 21 capsule Hanley Rispoli, Britta Mccreedy, MD     PDMP not reviewed this encounter.   Merrilee Jansky, MD 09/08/19 1504

## 2019-09-08 NOTE — Discharge Instructions (Signed)
Please quarantine with your family until COVID-19 test results are available If you have any worsening symptoms please return to the urgent care Increase oral fluid intake

## 2019-09-08 NOTE — ED Triage Notes (Signed)
Patient c/o sore throat and headache that started yesterday. She reports a cough that started this morning. Denies fever.

## 2019-10-06 ENCOUNTER — Ambulatory Visit
Admission: EM | Admit: 2019-10-06 | Discharge: 2019-10-06 | Disposition: A | Discharge status: Home / Self Care | Payer: Medicaid Other | Attending: Physician Assistant | Admitting: Physician Assistant

## 2019-10-06 ENCOUNTER — Encounter: Payer: Self-pay | Admitting: Emergency Medicine

## 2019-10-06 ENCOUNTER — Other Ambulatory Visit: Payer: Self-pay

## 2019-10-06 DIAGNOSIS — S0501XA Injury of conjunctiva and corneal abrasion without foreign body, right eye, initial encounter: Secondary | ICD-10-CM | POA: Diagnosis not present

## 2019-10-06 DIAGNOSIS — H5711 Ocular pain, right eye: Secondary | ICD-10-CM | POA: Diagnosis not present

## 2019-10-06 MED ORDER — KETOROLAC TROMETHAMINE 0.5 % OP SOLN: 1.0000 [drp] | Freq: Four times a day (QID) | OPHTHALMIC | 0 refills | Status: AC

## 2019-10-06 MED ORDER — IBUPROFEN 600 MG PO TABS: 600.0000 mg | ORAL_TABLET | Freq: Four times a day (QID) | ORAL | 0 refills | Status: AC | PRN

## 2019-10-06 MED ORDER — ERYTHROMYCIN 5 MG/GM OP OINT: TOPICAL_OINTMENT | OPHTHALMIC | 0 refills | Status: AC

## 2019-10-06 NOTE — ED Provider Notes (Signed)
Patient is she has a right-sided headache. [facility]    CSN: [id number] Arrival date & time: 10/06/19  6213      History   Chief Complaint Chief Complaint  Patient presents with  . Eye Injury    right    HPI Alyssa Evans is a 24 y.o. female.   Patient presents for right eye pain, redness and drainage.  She says that her infant son scratched her eye yesterday.  She has tried taking Tylenol and using warm compresses without relief.  She admits to photophobia.  She is not a contact lens wearer and does not wear glasses.  Patient has a right-sided headache.  She says it not severe.  Patient denies any fever.  She has no other injuries or concerns.  HPI  Past Medical History:  Diagnosis Date  . Acid reflux   . Anorexia   . Depression   . POTS (postural orthostatic tachycardia syndrome)     There are no problems to display for this patient.   Past Surgical History:  Procedure Laterality Date  . APPENDECTOMY    . DILATION AND CURETTAGE OF UTERUS    . NASAL SEPTUM SURGERY    . OVARIAN CYST REMOVAL      OB History   No obstetric history on file.      Home Medications    Prior to Admission medications   Medication Sig Start Date End Date Taking? Authorizing Provider  Prenatal Vit-Fe Fumarate-FA (PRENATAL VITAMINS) 28-0.8 MG TABS Take 1 tablet by mouth daily. 09/30/18  Yes Cook, Jayce G, DO  traZODone (DESYREL) 50 MG tablet Take 50 mg by mouth daily as needed.  10/21/14 10/06/19 Yes [provider]  benzonatate (TESSALON) 100 MG capsule Take 1 capsule (100 mg total) by mouth every 8 (eight) hours. 09/08/19   Merrilee Jansky, MD  erythromycin ophthalmic ointment Place a 1/2 inch ribbon of ointment into the lower eyelid every 6 hrs 10/06/19 10/13/19  Eusebio Friendly B, PA-C  ibuprofen (ADVIL) 600 MG tablet Take 1 tablet (600 mg total) by mouth every 6 (six) hours as needed for up to 7 days. 10/06/19 10/13/19  Shirlee Latch, PA-C  ketorolac  (ACULAR) 0.5 % ophthalmic solution Place 1 drop into the right eye every 6 (six) hours for 7 days. For pain 10/06/19 10/13/19  Shirlee Latch, PA-C  omeprazole (PRILOSEC) 40 MG capsule Take 1 capsule (40 mg total) by mouth daily for 30 days. 03/07/18 04/06/18  Triplett, Rulon Eisenmenger B, FNP  albuterol (PROVENTIL HFA;VENTOLIN HFA) 108 (90 Base) MCG/ACT inhaler Inhale into the lungs. 10/21/14 09/05/18  [provider]  fluticasone (FLONASE) 50 MCG/ACT nasal spray Place 2 sprays into both nostrils daily. Patient taking differently: Place 2 sprays into both nostrils daily as needed.  09/05/15 09/05/18  Hagler, Jami L, PA-C  promethazine (PHENERGAN) 12.5 MG tablet Take 1 tablet (12.5 mg total) by mouth every 6 (six) hours as needed for nausea or vomiting. 03/07/18 09/05/18  Chinita Pester, FNP    Family History Family History  Problem Relation Age of Onset  . Hyperlipidemia Mother   . Hypertension Mother   . Diabetes gravidarum Mother   . Other Father        unknown medical history  . Heart disease Brother 17       ? slight heart attack  . Syncope episode Brother   . Heart attack Maternal Grandmother     Social History Social History   Tobacco Use  .  Smoking status: Former Smoker    Packs/day: 0.50    Years: 3.00    Pack years: 1.50    Types: Cigarettes    Quit date: 10/06/2018    Years since quitting: 1.0  . Smokeless tobacco: Never Used  . Tobacco comment: somedays pt smoke pack a day.  Vaping Use  . Vaping Use: Some days  . Substances: Nicotine, Flavoring  Substance Use Topics  . Alcohol use: Yes    Alcohol/week: 1.0 standard drink    Types: 1 Cans of beer per week    Comment: socially  . Drug use: Not Currently    Types: Marijuana     Allergies   Patient has no known allergies.   Review of Systems Review of Systems  Constitutional: Negative for fatigue and fever.  HENT: Negative for congestion.   Eyes: Positive for photophobia, pain, discharge, redness and visual  disturbance. Negative for itching.  Respiratory: Negative for cough.   Gastrointestinal: Negative for nausea and vomiting.  Skin: Negative for rash and wound.  Neurological: Positive for headaches. Negative for dizziness, weakness and light-headedness.     Physical Exam Triage Vital Signs ED Triage Vitals  Enc Vitals Group     BP 10/06/19 0947 108/76     Pulse Rate 10/06/19 0947 91     Resp 10/06/19 0947 18     Temp 10/06/19 0947 99.1 F (37.3 C)     Temp Source 10/06/19 0947 Oral     SpO2 10/06/19 0947 100 %     Weight 10/06/19 0949 128 lb (58.1 kg)     Height 10/06/19 0949 5\' 7"  (1.702 m)     Head Circumference --      Peak Flow --      Pain Score 10/06/19 0948 9     Pain Loc --      Pain Edu? --      Excl. in [place]? --    No data found.  Updated Vital Signs BP 108/76 (BP Location: Left Arm)   Pulse 91   Temp 99.1 F (37.3 C) (Oral)   Resp 18   Ht 5\' 7"  (1.702 m)   Wt 128 lb (58.1 kg)   LMP 09/27/2019 (Exact Date)   SpO2 100%   BMI 20.05 kg/m   Visual Acuity Right Eye Distance: 20/100 Left Eye Distance: 20/40 Bilateral Distance: 20/40  Right Eye Near:   Left Eye Near:    Bilateral Near:     Physical Exam Vitals and nursing note reviewed.  Constitutional:      General: She is not in acute distress.    Appearance: Normal appearance. She is not ill-appearing or toxic-appearing.  HENT:     Head: Normocephalic and atraumatic.     Nose: Nose normal.     Mouth/Throat:     Mouth: Mucous membranes are moist.     Pharynx: Oropharynx is clear.  Eyes:     General: Lids are normal. Lids are everted, no foreign bodies appreciated. No scleral icterus.       Right eye: Discharge (clear) present.        Left eye: No discharge.     Conjunctiva/sclera:     Right eye: Right conjunctiva is injected ( photophobia).     Pupils: Pupils are equal, round, and reactive to light.     Left eye: Corneal abrasion (moderate size abrasion ~ 5 o'clock) present.      Comments: See  image  Cardiovascular:     Rate and  Rhythm: Normal rate and regular rhythm.     Heart sounds: Normal heart sounds.  Pulmonary:     Effort: Pulmonary effort is normal. No respiratory distress.     Breath sounds: Normal breath sounds.  Musculoskeletal:     Cervical back: Neck supple.  Skin:    General: Skin is dry.  Neurological:     General: No focal deficit present.     Mental Status: She is alert. Mental status is at baseline.     Motor: No weakness.     Gait: Gait normal.  Psychiatric:        Mood and Affect: Mood normal.        Behavior: Behavior normal.        Thought Content: Thought content normal.      UC Treatments / Results  Labs (all labs ordered are listed, but only abnormal results are displayed) Labs Reviewed - No data to display  EKG   Radiology No results found.  Procedures Procedures (including critical care time)  Medications Ordered in UC Medications - No data to display  Initial Impression / Assessment and Plan / UC Course  I have reviewed the triage vital signs and the nursing notes.  Pertinent labs & imaging results that were available during my care of the patient were reviewed by me and considered in my medical decision making (see chart for details).    I instilled 2 drops of tetracaine to right eye.  Then use fluorescein stain and Woods lamp to visualize a corneal abrasion at about 5 o'clock.  Treating at this time with erythromycin ointment, ketorolac drops as needed for pain relief.  Also provided patient with a supply of 600 mg ibuprofen for added pain relief.  Advise cool compresses.  Advised to follow-up with eye specialist if not improving over the next few days.  She can also follow-up with our clinic sooner for any new or worsening symptoms.   Final Clinical Impressions(s) / UC Diagnoses   Final diagnoses:  Abrasion of right cornea, initial encounter  Pain of right eye     Discharge Instructions     There is a scratch of the  cornea.  This will heal itself over the next week.  Use the erythromycin antibiotic ointment to help prevent infection.  Take oral Motrin for pain relief.  You can take the ketorolac eyedrops for added pain relief if you need to.  Use cool compresses to the eye.  If you have any increased pain, increased redness to the eye, increased drainage you should follow-up with [place] or eye specialist.    ED Prescriptions    Medication Sig Dispense Auth. Provider   ibuprofen (ADVIL) 600 MG tablet Take 1 tablet (600 mg total) by mouth every 6 (six) hours as needed for up to 7 days. 28 tablet Eusebio Friendly B, PA-C   erythromycin ophthalmic ointment Place a 1/2 inch ribbon of ointment into the lower eyelid every 6 hrs 3.5 g Michiel Cowboy, Analyah Mcconnon B, PA-C   ketorolac (ACULAR) 0.5 % ophthalmic solution Place 1 drop into the right eye every 6 (six) hours for 7 days. For pain 5 mL Shirlee Latch, PA-C     PDMP not reviewed this encounter.   Shirlee Latch, PA-C 10/06/19 1106

## 2019-10-06 NOTE — ED Triage Notes (Signed)
Patient in today c/o right eye pain, redness and drainage. Patient states her 4 month son scratched her in her right eye yesterday.

## 2019-10-06 NOTE — Discharge Instructions (Addendum)
There is a scratch of the cornea.  This will heal itself over the next week.  Use the erythromycin antibiotic ointment to help prevent infection.  Take oral Motrin for pain relief.  You can take the ketorolac eyedrops for added pain relief if you need to.  Use cool compresses to the eye.  If you have any increased pain, increased redness to the eye, increased drainage you should follow-up with [place] or eye specialist.

## 2020-07-04 ENCOUNTER — Other Ambulatory Visit: Payer: Self-pay

## 2020-07-04 ENCOUNTER — Ambulatory Visit
Admission: EM | Admit: 2020-07-04 | Discharge: 2020-07-04 | Disposition: A | Discharge status: Home / Self Care | Payer: Medicaid Other

## 2020-07-04 DIAGNOSIS — T43295A Adverse effect of other antidepressants, initial encounter: Secondary | ICD-10-CM | POA: Diagnosis not present

## 2020-07-04 DIAGNOSIS — Z3A12 12 weeks gestation of pregnancy: Secondary | ICD-10-CM | POA: Diagnosis not present

## 2020-07-04 DIAGNOSIS — O99891 Other specified diseases and conditions complicating pregnancy: Secondary | ICD-10-CM

## 2020-07-04 DIAGNOSIS — T50905A Adverse effect of unspecified drugs, medicaments and biological substances, initial encounter: Secondary | ICD-10-CM

## 2020-07-04 NOTE — ED Triage Notes (Signed)
Patient states that she is currently [redacted] weeks pregnant. States that she has been taking cymbalta from [facility] and states that she has been unable to tolerate the medication, vomits when taking medicine. States that she feels depressed, low blood pressure and feels faint without the medication. States that she is concerned that she is having withdrawal symptoms that will effect her child.

## 2020-07-04 NOTE — Discharge Instructions (Addendum)
Please go to [facility] emergency department for evaluation by obstetrics.

## 2020-07-04 NOTE — ED Provider Notes (Signed)
[facility]    CSN: [id number] Arrival date & time: 07/04/20  0920      History   Chief Complaint Chief Complaint  Patient presents with   Vomiting    HPI Alyssa Evans is a 25 y.o. female.   HPI  25 year old female here for evaluation of vomiting, low blood pressure, dizziness, and feeling depressed.  She is currently taking Cymbalta but has not been able to tolerate the medication for the last 4 days.  She reports that when she does take it she becomes hot, sweaty, fatigued, dizzy, and has an episode of vomiting 15 minutes after taking the Cymbalta.  She states that she remains dizzy and nauseous despite Zofran and Diclegis.  Patient called the OB triage line and was referred to urgent care.  Past Medical History:  Diagnosis Date   Acid reflux    Anorexia    Depression    POTS (postural orthostatic tachycardia syndrome)       Past Surgical History:  Procedure Laterality Date   APPENDECTOMY     DILATION AND CURETTAGE OF UTERUS     NASAL SEPTUM SURGERY     OVARIAN CYST REMOVAL      OB History     Gravida  1   Para      Term      Preterm      AB      Living         SAB      IAB      Ectopic      Multiple      Live Births               Home Medications    Prior to Admission medications   Medication Sig Start Date End Date Taking? Authorizing Provider  DICLEGIS 10-10 MG TBEC Take by mouth 2 (two) times daily. 06/08/20  Yes [provider]  Prenatal Vit-Fe Fumarate-FA (PRENATAL VITAMINS) 28-0.8 MG TABS Take 1 tablet by mouth daily. 09/30/18  Yes Cook, Jayce G, DO  albuterol (PROVENTIL HFA;VENTOLIN HFA) 108 (90 Base) MCG/ACT inhaler Inhale into the lungs. 10/21/14 09/05/18  [provider]  fluticasone (FLONASE) 50 MCG/ACT nasal spray Place 2 sprays into both nostrils daily. Patient taking differently: Place 2 sprays into both nostrils daily as needed.  09/05/15 09/05/18  Hagler, Jami L, PA-C  promethazine  (PHENERGAN) 12.5 MG tablet Take 1 tablet (12.5 mg total) by mouth every 6 (six) hours as needed for nausea or vomiting. 03/07/18 09/05/18  Chinita Pester, FNP    Family History Family History  Problem Relation Age of Onset   Hyperlipidemia Mother    Hypertension Mother    Diabetes gravidarum Mother    Other Father        unknown medical history   Heart disease Brother 17       ? slight heart attack   Syncope episode Brother    Heart attack Maternal Grandmother     Social History Social History   Tobacco Use   Smoking status: Former    Packs/day: 0.50    Years: 3.00    Pack years: 1.50    Types: Cigarettes    Quit date: 10/06/2018    Years since quitting: 1.7   Smokeless tobacco: Never   Tobacco comments:    somedays pt smoke pack a day.  Vaping Use   Vaping Use: Some days   Substances: Nicotine, Flavoring  Substance Use Topics   Alcohol use:  Yes    Alcohol/week: 1.0 standard drink    Types: 1 Cans of beer per week    Comment: socially   Drug use: Not Currently    Types: Marijuana     Allergies   Patient has no known allergies.   Review of Systems Review of Systems  Constitutional:  Positive for diaphoresis and fatigue. Negative for activity change and appetite change.  Gastrointestinal:  Positive for nausea and vomiting.  Psychiatric/Behavioral:  The patient is nervous/anxious.        Patient reports that she is feeling more depressed since she has not been taking her Cymbalta for the last 4 days.    Physical Exam Triage Vital Signs ED Triage Vitals  Enc Vitals Group     BP 07/04/20 0955 121/86     Pulse Rate 07/04/20 0955 90     Resp 07/04/20 0955 17     Temp 07/04/20 0955 98.5 F (36.9 C)     Temp Source 07/04/20 0955 Oral     SpO2 07/04/20 0955 100 %     Weight 07/04/20 0952 122 lb (55.3 kg)     Height 07/04/20 0952 5\' 7"  (1.702 m)     Head Circumference --      Peak Flow --      Pain Score 07/04/20 0952 6     Pain Loc --      Pain Edu? --       Excl. in GC? --    No data found.  Updated Vital Signs BP 121/86 (BP Location: Right Arm)   Pulse 90   Temp 98.5 F (36.9 C) (Oral)   Resp 17   Ht 5\' 7"  (1.702 m)   Wt 122 lb (55.3 kg)   LMP 09/27/2019 (Exact Date)   SpO2 100%   Breastfeeding No   BMI 19.11 kg/m   Visual Acuity Right Eye Distance:   Left Eye Distance:   Bilateral Distance:    Right Eye Near:   Left Eye Near:    Bilateral Near:     Physical Exam Vitals and nursing note reviewed.  Constitutional:      General: She is not in acute distress.    Appearance: Normal appearance. She is normal weight. She is not ill-appearing.  HENT:     Head: Normocephalic and atraumatic.  Skin:    General: Skin is warm and dry.     Capillary Refill: Capillary refill takes less than 2 seconds.     Findings: No erythema or rash.  Neurological:     General: No focal deficit present.     Mental Status: She is alert and oriented to person, place, and time.  Psychiatric:        Mood and Affect: Mood normal.        Behavior: Behavior normal.        Thought Content: Thought content normal.        Judgment: Judgment normal.     UC Treatments / Results  Labs (all labs ordered are listed, but only abnormal results are displayed) Labs Reviewed - No data to display  EKG   Radiology No results found.  Procedures Procedures (including critical care time)  Medications Ordered in UC Medications - No data to display  Initial Impression / Assessment and Plan / UC Course  I have reviewed the triage vital signs and the nursing notes.  Pertinent labs & imaging results that were available during my care of the patient were reviewed by me  and considered in my medical decision making (see chart for details).  Patient is a very pleasant 25 year old female who is [redacted] weeks pregnant here for evaluation of nausea, vomiting, dizziness, fatigue, hot flashes and sweats, and increased depression.  She is currently taking Cymbalta  with the approval of her physician.  She sees [facility] family medicine.  She reports that she has not been able to take the medication for last 4 days because it she vomits 15 to 20 minutes after taking it despite taking Diclegis and Zofran.  Patient denies having a fever with the above-mentioned episodes.  She does report that she has had some low blood pressure with a systolic in the 90s when she is feeling dizzy.  Blood pressure and vital signs are within normal limits here and there is no hypotension or tachycardia noted.  Patient advised that her symptoms are consistent with withdrawal verses serotonin syndrome though patient has not had high blood pressure.  She does report agitation, rapid heart rate, heavy sweating, and headache.  She is also not had any high fevers or seizures.  There is definite concern for medication intolerance versus reaction and I have advised the patient that she needs to be evaluated at [facility] where she receives her prenatal care.  She may need to be weaned off of the Cymbalta and placed on another medication to help her with her depression.  Patient is in agreement and left ambulatory via POV to go to [facility] in [place].    Final Clinical Impressions(s) / UC Diagnoses   Final diagnoses:  Medication reaction, initial encounter     Discharge Instructions      Please go to [facility] emergency department for evaluation by obstetrics.     ED Prescriptions   None    PDMP not reviewed this encounter.   Becky Augusta, NP 07/04/20 1021

## 2020-10-19 ENCOUNTER — Ambulatory Visit
Admission: EM | Admit: 2020-10-19 | Discharge: 2020-10-19 | Disposition: A | Discharge status: Home / Self Care | Payer: Medicaid Other | Attending: Emergency Medicine | Admitting: Emergency Medicine

## 2020-10-19 ENCOUNTER — Other Ambulatory Visit: Payer: Self-pay

## 2020-10-19 DIAGNOSIS — L03113 Cellulitis of right upper limb: Secondary | ICD-10-CM | POA: Diagnosis not present

## 2020-10-19 DIAGNOSIS — W57XXXA Bitten or stung by nonvenomous insect and other nonvenomous arthropods, initial encounter: Secondary | ICD-10-CM

## 2020-10-19 MED ORDER — MUPIROCIN 2 % EX OINT: 1.0000 "application " | TOPICAL_OINTMENT | Freq: Two times a day (BID) | CUTANEOUS | 0 refills | Status: DC

## 2020-10-19 NOTE — Discharge Instructions (Addendum)
Apply mupirocin ointment twice daily to your infected insect bite.  Continue to observe for decreasing redness.  If you develop increasing redness, swelling, drainage or fever follow-up with your OB/GYN or the ER.

## 2020-10-19 NOTE — ED Provider Notes (Signed)
[facility]    CSN: [id number] Arrival date & time: 10/19/20  8309      History   Chief Complaint Chief Complaint  Patient presents with   Insect Bite    HPI Alyssa Evans is a 25 y.o. female.   HPI  25 year old female here for evaluation of insect bite.  Patient reports that she awoke about 3 AM 2 days ago and had a red raised area on the backside of her right forearm with a pus filled blister.  She reports that the blister ruptured and draining clear fluid.  She continues to have a clear fluid draining on occasion.  She states that she marked the area of redness with a pen and it initially increased but over the last day the redness has receded but she still having some swelling and pain.  She is also complaining of pain and muscle cramps in her right arm.  She denies fever.  Patient is currently [redacted] weeks pregnant and was advised to come to the urgent care by her obstetrician.  Past Medical History:  Diagnosis Date   Acid reflux    Anorexia    Depression    POTS (postural orthostatic tachycardia syndrome)     There are no problems to display for this patient.   Past Surgical History:  Procedure Laterality Date   APPENDECTOMY     DILATION AND CURETTAGE OF UTERUS     NASAL SEPTUM SURGERY     OVARIAN CYST REMOVAL      OB History     Gravida  1   Para      Term      Preterm      AB      Living         SAB      IAB      Ectopic      Multiple      Live Births               Home Medications    Prior to Admission medications   Medication Sig Start Date End Date Taking? Authorizing Provider  mupirocin ointment (BACTROBAN) 2 % Apply 1 application topically 2 (two) times daily. 10/19/20  Yes Becky Augusta, NP  DICLEGIS 10-10 MG TBEC Take by mouth 2 (two) times daily. 06/08/20   [provider]  Prenatal Vit-Fe Fumarate-FA (PRENATAL VITAMINS) 28-0.8 MG TABS Take 1 tablet by mouth daily. 09/30/18   Tommie Sams, DO   albuterol (PROVENTIL HFA;VENTOLIN HFA) 108 (90 Base) MCG/ACT inhaler Inhale into the lungs. 10/21/14 09/05/18  [provider]  fluticasone (FLONASE) 50 MCG/ACT nasal spray Place 2 sprays into both nostrils daily. Patient taking differently: Place 2 sprays into both nostrils daily as needed.  09/05/15 09/05/18  Hagler, Jami L, PA-C  promethazine (PHENERGAN) 12.5 MG tablet Take 1 tablet (12.5 mg total) by mouth every 6 (six) hours as needed for nausea or vomiting. 03/07/18 09/05/18  Chinita Pester, FNP    Family History Family History  Problem Relation Age of Onset   Hyperlipidemia Mother    Hypertension Mother    Diabetes gravidarum Mother    Other Father        unknown medical history   Heart disease Brother 17       ? slight heart attack   Syncope episode Brother    Heart attack Maternal Grandmother     Social History Social History   Tobacco Use   Smoking status: Former  Packs/day: 0.50    Years: 3.00    Pack years: 1.50    Types: Cigarettes    Quit date: 10/06/2018    Years since quitting: 2.0   Smokeless tobacco: Never   Tobacco comments:    somedays pt smoke pack a day.  Vaping Use   Vaping Use: Some days   Substances: Nicotine, Flavoring  Substance Use Topics   Alcohol use: Yes    Alcohol/week: 1.0 standard drink    Types: 1 Cans of beer per week    Comment: socially   Drug use: Not Currently    Types: Marijuana     Allergies   Patient has no known allergies.   Review of Systems Review of Systems  Constitutional:  Negative for activity change, appetite change and fever.  Musculoskeletal:  Positive for myalgias.  Skin:  Positive for color change and wound.  Neurological:  Negative for weakness.  Hematological: Negative.   Psychiatric/Behavioral: Negative.      Physical Exam Triage Vital Signs ED Triage Vitals  Enc Vitals Group     BP 10/19/20 0854 108/72     Pulse Rate 10/19/20 0854 86     Resp 10/19/20 0854 18     Temp 10/19/20 0854  98.4 F (36.9 C)     Temp src --      SpO2 10/19/20 0854 100 %     Weight --      Height --      Head Circumference --      Peak Flow --      Pain Score 10/19/20 0852 3     Pain Loc --      Pain Edu? --      Excl. in GC? --    No data found.  Updated Vital Signs BP 108/72   Pulse 86   Temp 98.4 F (36.9 C)   Resp 18   SpO2 100%   Visual Acuity Right Eye Distance:   Left Eye Distance:   Bilateral Distance:    Right Eye Near:   Left Eye Near:    Bilateral Near:     Physical Exam Vitals and nursing note reviewed.  Constitutional:      General: She is not in acute distress.    Appearance: Normal appearance. She is not ill-appearing.  HENT:     Head: Normocephalic and atraumatic.  Skin:    General: Skin is warm and dry.     Findings: Erythema and lesion present.  Neurological:     General: No focal deficit present.     Mental Status: She is alert and oriented to person, place, and time.  Psychiatric:        Mood and Affect: Mood normal.        Behavior: Behavior normal.        Thought Content: Thought content normal.        Judgment: Judgment normal.     UC Treatments / Results  Labs (all labs ordered are listed, but only abnormal results are displayed) Labs Reviewed - No data to display  EKG   Radiology No results found.  Procedures Procedures (including critical care time)  Medications Ordered in UC Medications - No data to display  Initial Impression / Assessment and Plan / UC Course  I have reviewed the triage vital signs and the nursing notes.  Pertinent labs & imaging results that were available during my care of the patient were reviewed by me and considered in my medical decision  making (see chart for details).  Patient is a pleasant, nontoxic-appearing 25 year old female here for evaluation of an insect bite on the posterior aspect of her right forearm that has been present for the past 2 days.  She states that this lesion is causing  muscle cramps in her right forearm.  She has not had a fever.  She does report that the drainage from the area is serous in nature.  Patient's physical exam reveals a mildly edematous and erythematous lesion that is approximately the size of an eraser with a crusty yellow center and erythematous base.  The area is tender but not significantly hot to touch.  There is no drainage present.  Patient has 2 concentric rings on her right arm surrounding the lesion that she indicates where the redness initially was, spread to, and the redness is now receded within both of those rings.  Patient has no fasciculations of her forearm muscles and she has no abnormalities with range of motion or strength in her right forearm.  It is unclear as to what the etiology of the bite is but does appear to be an insect bite.  Due to the fact the patient is [redacted] weeks pregnant, she has spoken to her OB who referred her to the urgent care, we will treat patient with topical mupirocin twice daily for 7 days.  I have advised the patient that if her redness returns, increases, is associated with swelling, pus discharge, or fever she needs to go to the ER for evaluation as oral antibiotic options are limited given her pregnancy status and she may need IV antibiotics.   Final Clinical Impressions(s) / UC Diagnoses   Final diagnoses:  Bug bite with infection, initial encounter     Discharge Instructions      Apply mupirocin ointment twice daily to your infected insect bite.  Continue to observe for decreasing redness.  If you develop increasing redness, swelling, drainage or fever follow-up with your OB/GYN or the ER.      ED Prescriptions     Medication Sig Dispense Auth. Provider   mupirocin ointment (BACTROBAN) 2 % Apply 1 application topically 2 (two) times daily. 22 g Becky Augusta, NP      PDMP not reviewed this encounter.   Becky Augusta, NP 10/19/20 [phone]

## 2020-10-19 NOTE — ED Triage Notes (Signed)
Pt presents with c/o insect bite on right forearm from 2 days ago that she is concerned for infection, has c/o body aches

## 2021-06-27 ENCOUNTER — Other Ambulatory Visit
Admission: RE | Admit: 2021-06-27 | Discharge: 2021-06-27 | Disposition: A | Discharge status: Home / Self Care | Payer: Medicaid Other | Source: Ambulatory Visit | Attending: Obstetrics | Admitting: Obstetrics

## 2021-06-27 DIAGNOSIS — O3680X0 Pregnancy with inconclusive fetal viability, not applicable or unspecified: Secondary | ICD-10-CM | POA: Diagnosis not present

## 2021-06-27 LAB — HCG, QUANTITATIVE, PREGNANCY: hCG, Beta Chain, Quant, S: 5 m[IU]/mL — ABNORMAL HIGH (ref <5)

## 2022-01-31 NOTE — H&P (Signed)
Preoperative History and Physical   Chief Complaint:  Alyssa Evans is a 27 y.o. [phone] here for surgical management of abnormal uterine bleeding and chronic pelvic pain.   No significant preoperative concerns.   History of Present Illness:  26 y.o. [phone] female who presents with multiple symptoms.   She has dyspareunia.  This has been going on for a long time. Depth of penetration does not matter.  This hurts on the inside instead of in her vagina.    She has pain in her ovaries. This is worse with activity.   Her menses are becoming further apart.  They used to be 30 days apart, now 47 days.  Her bleeding is a lot heavier such that she is soaking super plus tampon in one hour. This lasts for about 5 days during her period.  Her periods last 7 days and then she has a week of brown discharge.   She has pain in her ovaries and pelvis when she has a bowel movement.  She starts sweating and crying when she has a BM. This happens 3-4 times per day.  These are somewhat solid BMs. Other times the stool is more liquid-like.     She has bad cramping with her periods (radiates to thighs, hips). She has to lie in bed during this time.   She is losing weight, though she is not changing her diet.     She is having intense headaches.  She is sweating.     The pain in her pelvic area that is present every day. The intensity varies.  Some days she just knows it's there.  Other days she has to lie in bed and nothing seems to help.    She also has recurrent BV. She has gotten it treated. She has tried Boric acid.     Her PCP was in [place] and she has moved to [place].     She would like to see if she has endometriosis.  She also wants to know whether she has cysts on her ovaries. She had some cysts removed in 2020. She doesn't recall whether they told her anything about the surgery. She states that she didn't follow up.    She doesn't want to go on birth control since she is contemplating  having her third child sometime in the future.    Proposed surgery: robot assisted diagnostic laparoscopy       Past Medical History:  Diagnosis Date   Allergic state     Anorexia nervosa     Depression     FTND (full term normal delivery)     Immunizations up to date     Sinusitis, chronic, unspecified           Past Surgical History:  Procedure Laterality Date   MAXILLARY SINUSOTOMY INTRANASAL Bilateral 05/15/2012    Procedure: MAXILLARY SINUSOTOMY INTRANASAL;  Surgeon: Viann Fish, MD;  Location: [facility];  Service: Otolaryngology Head and Neck;  Laterality: Bilateral;   NASAL ENDOSCOPY Bilateral 05/15/2012    Procedure: NASAL/SINUS ENDOSCOPY,REMV TOTL ETHMOID;  Surgeon: Viann Fish, MD;  Location: [place];  Service: Otolaryngology Head and Neck;  Laterality: Bilateral;   SEPTOPLASTY N/A 05/15/2012    Procedure: SEPTOPLASTY;  Surgeon: Viann Fish, MD;  Location: [facility] [facility] [place] [facility];  Service: Otolaryngology Head and Neck;  Laterality: N/A;   SPHENOID SINUSOTOMY Bilateral 05/15/2012    Procedure: SPHENOID SINUSOTOMY;  Surgeon: Viann Fish, MD;  Location: [place];  Service: Otolaryngology Head and Neck;  Laterality: Bilateral;   FRONTAL SINUSOTOMY Bilateral 05/15/2012    Procedure: FRONTAL SINUSOTOMY;  Surgeon: Edwina Barth, MD;  Location: [facility];  Service: Otolaryngology Head and Neck;  Laterality: Bilateral;   CAUTERIZATION/ABLATION NASAL TURBINATE MUCOSA Bilateral 05/15/2012    Procedure: CAUTER Joni Fears;  Surgeon: Edwina Barth, MD;  Location: [facility];  Service: Otolaryngology Head and Neck;  Laterality: Bilateral;   LAPAROSCOPIC APPENDECTOMY N/A 09/12/2013    Procedure: LAPAROSCOPIC APPENDECTOMY;  Surgeon: Weyman Rodney, MD;  Location: [facility];  Service: Pediatric Surgery;  Laterality: N/A;                     OB History  Gravida Para Term Preterm AB Living   4 2 2   2 2   SAB IAB Ectopic Molar Multiple Live Births   2       0 2     # Outcome Date GA Lbr Len/2nd Weight Sex Delivery Anes PTL Lv  4 SAB 2023                  3 Term 01/07/21 [redacted]w[redacted]d 03:55 / 00:16 3.63 kg (8 lb) M Vag-Spont EPI   LIV  2 Term 06/16/19 [redacted]w[redacted]d     M Vag-Spont     LIV  1 SAB 2014                  Patient denies any other pertinent gynecologic issues.    Current Outpatient Medications: Denies      No Known Allergies   Social History:   reports that she has quit smoking. She has never used smokeless tobacco. She reports that she does not drink alcohol and does not use drugs.        Family History  Problem Relation Age of Onset   Allergies Mother     High blood pressure (Hypertension) Mother     Diabetes type II Mother     Ulcers Mother     Migraines Maternal Grandmother     High blood pressure (Hypertension) Maternal Grandmother     Cancer Maternal Grandmother          skin cancer   Ulcers Maternal Grandmother     Anesthesia problems Neg Hx        Review of Systems: Noncontributory   PHYSICAL EXAM: Blood pressure 113/76, pulse 97, weight 46.3 kg (102 lb 1.2 oz), last menstrual period 01/01/2022, not currently breastfeeding. CONSTITUTIONAL: Well-developed, well-nourished female in no acute distress.  HENT:  Normocephalic, atraumatic, External right and left ear normal. Oropharynx is clear and moist EYES: Conjunctivae and EOM are normal. Pupils are equal, round, and reactive to light. No scleral icterus.  NECK: Normal range of motion, supple, no masses SKIN: Skin is warm and dry. No rash noted. Not diaphoretic. No erythema. No pallor. NEUROLGIC: Alert and oriented to person, place, and time. Normal reflexes, muscle tone coordination. No cranial nerve deficit noted. PSYCHIATRIC: Normal mood and affect. Normal behavior. Normal judgment and thought content. CARDIOVASCULAR: Normal heart rate noted, regular rhythm RESPIRATORY: Effort and breath sounds normal, no  problems with respiration noted ABDOMEN: Soft, nontender, nondistended. PELVIC: Deferred MUSCULOSKELETAL: Normal range of motion. No edema and no tenderness. 2+ distal pulses.   Labs: Recent Results  No results found for this or any previous visit (from the past 336 hour(s)).     Imaging Studies: No results found.   Assessment: 1. Chronic pelvic pain  in female   2. Abnormal uterine bleeding (AUB)   3. Dyspareunia, female     Plan: Patient will undergo surgical management with the above-noted surger.   The risks of surgery were discussed in detail with the patient including but not limited to: bleeding which may require transfusion or reoperation; infection which may require antibiotics; injury to surrounding organs which may involve bowel, bladder, ureters ; need for additional procedures including laparoscopy or laparotomy; thromboembolic phenomenon, surgical site problems and other postoperative/anesthesia complications. Likelihood of success in alleviating the patient's condition was discussed. Routine postoperative instructions will be reviewed with the patient and her family in detail after surgery.  The patient concurred with the proposed plan, giving informed written consent for the surgery.   Preoperative prophylactic antibiotics, as indicated, and SCDs ordered on call to the OR.     Serum pregnancy test today due to patient experiencing some odd symptoms.    Attestation Statement:    I personally performed the service. (TP)   [place], MD  [place] 01/29/2022 2:47 PM

## 2022-02-09 ENCOUNTER — Encounter
Admission: RE | Admit: 2022-02-09 | Discharge: 2022-02-09 | Disposition: A | Discharge status: Home / Self Care | Payer: Medicaid Other | Source: Ambulatory Visit | Attending: Obstetrics and Gynecology | Admitting: Obstetrics and Gynecology

## 2022-02-09 ENCOUNTER — Other Ambulatory Visit: Payer: Self-pay

## 2022-02-09 DIAGNOSIS — Z01812 Encounter for preprocedural laboratory examination: Secondary | ICD-10-CM

## 2022-02-09 HISTORY — DX: Cardiac arrhythmia, unspecified: I49.9

## 2022-02-09 HISTORY — DX: Other specified postprocedural states: Z98.890

## 2022-02-09 HISTORY — DX: Type 2 diabetes mellitus without complications: E11.9

## 2022-02-09 HISTORY — DX: Anxiety disorder, unspecified: F41.9

## 2022-02-09 HISTORY — DX: Pneumonia, unspecified organism: J18.9

## 2022-02-09 NOTE — Patient Instructions (Addendum)
Your procedure is scheduled on: 02/15/22 - Thursday Report to the Registration Desk on the 1st floor of the [facility]. To find out your arrival time, please call [phone] between 1PM - 3PM on: 02/14/22 - Wednesday If your arrival time is 6:00 am, do not arrive prior to that time as the [facility] entrance doors do not open until 6:00 am.  REMEMBER: Instructions that are not followed completely may result in serious medical risk, up to and including death; or upon the discretion of your surgeon and anesthesiologist your surgery may need to be rescheduled.  Do not eat food after midnight the night before surgery.  No gum chewing, lozengers or hard candies.  You may however, drink CLEAR liquids up to 2 hours before you are scheduled to arrive for your surgery. Do not drink anything within 2 hours of your scheduled arrival time.  Clear liquids include: - water  - apple juice without pulp - gatorade (not RED colors) - black coffee or tea (Do NOT add milk or creamers to the coffee or tea) Do NOT drink anything that is not on this list..  In addition, your doctor has ordered for you to drink the provided  Ensure Pre-Surgery Clear Carbohydrate Drink  Drinking this carbohydrate drink up to two hours before surgery helps to reduce insulin resistance and improve patient outcomes. Please complete drinking 2 hours prior to scheduled arrival time.  TAKE THESE MEDICATIONS THE MORNING OF SURGERY WITH A SIP OF WATER: NONE  One week prior to surgery: Stop Anti-inflammatories (NSAIDS) such as Advil, Aleve, Ibuprofen, Motrin, Naproxen, Naprosyn and Aspirin based products such as Excedrin, Goodys Powder, BC Powder.  Stop ANY OVER THE COUNTER supplements until after surgery.  You may however, continue to take Tylenol if needed for pain up until the day of surgery.  No Alcohol for 24 hours before or after surgery.  No Smoking including e-cigarettes for 24 hours prior to surgery.  No chewable  tobacco products for at least 6 hours prior to surgery.  No nicotine patches on the day of surgery.  Do not use any "recreational" drugs for at least a week prior to your surgery.  Please be advised that the combination of cocaine and anesthesia may have negative outcomes, up to and including death. If you test positive for cocaine, your surgery will be cancelled.  On the morning of surgery brush your teeth with toothpaste and water, you may rinse your mouth with mouthwash if you wish. Do not swallow any toothpaste or mouthwash.  Use CHG Soap or wipes as directed on instruction sheet.  Do not wear jewelry, make-up, hairpins, clips or nail polish.  Do not wear lotions, powders, or perfumes.   Do not shave body from the neck down 48 hours prior to surgery just in case you cut yourself which could leave a site for infection.  Also, freshly shaved skin may become irritated if using the CHG soap.  Contact lenses, hearing aids and dentures may not be worn into surgery.  Do not bring valuables to the hospital. [facility] is not responsible for any missing/lost belongings or valuables.   Notify your doctor if there is any change in your medical condition (cold, fever, infection).  Wear comfortable clothing (specific to your surgery type) to the hospital.  After surgery, you can help prevent lung complications by doing breathing exercises.  Take deep breaths and cough every 1-2 hours. Your doctor may order a device called an Incentive Spirometer to help  you take deep breaths. When coughing or sneezing, hold a pillow firmly against your incision with both hands. This is called "splinting." Doing this helps protect your incision. It also decreases belly discomfort.  If you are being admitted to the hospital overnight, leave your suitcase in the car. After surgery it may be brought to your room.  In case of increased patient census, it may be necessary for you, the patient, to continue your  postoperative care within the Same Day Surgery department.  If you are being discharged the day of surgery, you will not be allowed to drive home. You will need a responsible individual to drive you home and stay with you for 24 hours after surgery.   If you are taking public transportation, you will need to have a responsible adult (18 years or older) with you. Please confirm with your physician that it is acceptable to use public transportation.   Please call the [facility] at [phone] if you have any questions about these instructions.  Surgery Visitation Policy:  Patients undergoing a surgery or procedure may have two family members or support persons with them as long as the person is not COVID-19 positive or experiencing its symptoms.   Inpatient Visitation:    Visiting hours are 7 a.m. to 8 p.m. Up to four visitors are allowed at one time in a patient room. The visitors may rotate out with other people during the day. One designated support person (adult) may remain overnight.  Due to an increase in RSV and influenza rates and associated hospitalizations, children ages 26 and under will not be able to visit patients in [facility]. Masks continue to be strongly recommended.  Pre-operative CHG Instructions  You can play a key role in reducing the risk of infection after surgery. Your skin is not sterile and needs to be as free of germs as possible. You can reduce the number of germs on your skin by washing with CHG (chlorhexidine gluconate) soap before surgery. CHG is an antiseptic soap that kills germs and bonds with the skin to continue killing germs even after washing.  Please DO NOT use if you have an allergy to chlorhexidine/ CHG or antibacterial soaps. If your skin becomes reddened/irritated stop using the CHG and inform your nurse when you arrive at Short Stay.  Do not shave (including legs and underarms) for at least 48 hours prior to the  first CHG shower. You may shave your face.  Please follow these instructions carefully:  1.  Shower with CHG Soap the night before surgery and the morning of surgery. 2.  If you choose to wash your hair, wash your hair first as usual with your normal shampoo/ conditioner. Wash your genitals (private parts) with your normal soap. 4.  After you shampoo/ condition hair and wash your private parts, rinse your hair and body thoroughly to remove the shampoo/ conditioner/ soap. 5.  Use CHG as you would any other liquid soap. You can apply CHG directly to the skin and wash gently using a clean washcloth. 6.  Apply the CHG Soap to your body ONLY FROM THE NECK DOWN.         Do not use on open wounds or open sores.  Avoid contact with your eyes, ears, mouth, and genitals (private parts).  Wash thoroughly, paying special attention to the area where your surgery will be performed. 7.  Thoroughly rinse your body with warm water from the neck down. 8.  DO  NOT shower/ wash with your normal soap after using and rinsing off the CHG Soap. 9.  Pat yourself dry with a clean towel. DO NOT use lotions, powders, deodorants, or other non-prescription skin care products after taking the CHG bath. 10.  Wear clean pajamas. 11.  Place clean sheets on your bed the night of your first shower and do not sleep with pets.  Day of Surgery  Use CHG Soap as instructed above. Do not apply any lotions, deodorants, cologne, or perfumes on the morning of surgery. Ask your nurse before applying any prescription medications to the skin. Please wear clean clothes to the hospital/surgery center.

## 2022-02-13 ENCOUNTER — Encounter
Admission: RE | Admit: 2022-02-13 | Discharge: 2022-02-13 | Disposition: A | Discharge status: Home / Self Care | Payer: Medicaid Other | Source: Ambulatory Visit | Attending: Obstetrics and Gynecology | Admitting: Obstetrics and Gynecology

## 2022-02-13 DIAGNOSIS — Z01818 Encounter for other preprocedural examination: Secondary | ICD-10-CM | POA: Diagnosis present

## 2022-02-13 DIAGNOSIS — Z01812 Encounter for preprocedural laboratory examination: Secondary | ICD-10-CM

## 2022-02-13 LAB — TYPE AND SCREEN
ABO/RH(D): O POS
Antibody Screen: NEGATIVE

## 2022-02-13 LAB — CBC
HCT: 41.6 % (ref 36.0–46.0)
Hemoglobin: 14.1 g/dL (ref 12.0–15.0)
MCH: 32 pg (ref 26.0–34.0)
MCHC: 33.9 g/dL (ref 30.0–36.0)
MCV: 94.5 fL (ref 80.0–100.0)
Platelets: 151 10*3/uL (ref 150–400)
RBC: 4.4 MIL/uL (ref 3.87–5.11)
RDW: 11.5 % (ref 11.5–15.5)
WBC: 4.9 10*3/uL (ref 4.0–10.5)
nRBC: 0 % (ref 0.0–0.2)

## 2022-02-15 ENCOUNTER — Encounter
Admission: RE | Disposition: A | Discharge status: Home / Self Care | Payer: Self-pay | Source: Ambulatory Visit | Attending: Obstetrics and Gynecology

## 2022-02-15 ENCOUNTER — Ambulatory Visit: Payer: Medicaid Other | Admitting: Urgent Care

## 2022-02-15 ENCOUNTER — Ambulatory Visit
Admission: RE | Admit: 2022-02-15 | Discharge: 2022-02-15 | Disposition: A | Discharge status: Home / Self Care | Payer: Medicaid Other | Source: Ambulatory Visit | Attending: Obstetrics and Gynecology | Admitting: Obstetrics and Gynecology

## 2022-02-15 ENCOUNTER — Other Ambulatory Visit: Payer: Self-pay

## 2022-02-15 ENCOUNTER — Encounter: Payer: Self-pay | Admitting: Obstetrics and Gynecology

## 2022-02-15 DIAGNOSIS — Z01812 Encounter for preprocedural laboratory examination: Secondary | ICD-10-CM

## 2022-02-15 DIAGNOSIS — N80399 Endometriosis of the pelvic peritoneum, other specified sites, unspecified depth: Secondary | ICD-10-CM | POA: Insufficient documentation

## 2022-02-15 DIAGNOSIS — N736 Female pelvic peritoneal adhesions (postinfective): Secondary | ICD-10-CM | POA: Insufficient documentation

## 2022-02-15 DIAGNOSIS — R102 Pelvic and perineal pain unspecified side: Secondary | ICD-10-CM | POA: Diagnosis present

## 2022-02-15 DIAGNOSIS — G8929 Other chronic pain: Secondary | ICD-10-CM | POA: Insufficient documentation

## 2022-02-15 DIAGNOSIS — Z87891 Personal history of nicotine dependence: Secondary | ICD-10-CM | POA: Insufficient documentation

## 2022-02-15 DIAGNOSIS — N803C1 Endometriosis of the right uterosacral ligament, unspecified depth: Secondary | ICD-10-CM | POA: Diagnosis not present

## 2022-02-15 DIAGNOSIS — Z9049 Acquired absence of other specified parts of digestive tract: Secondary | ICD-10-CM | POA: Insufficient documentation

## 2022-02-15 DIAGNOSIS — N941 Unspecified dyspareunia: Secondary | ICD-10-CM | POA: Diagnosis not present

## 2022-02-15 DIAGNOSIS — N80329 Endometriosis of the posterior cul-de-sac, unspecified depth: Secondary | ICD-10-CM | POA: Insufficient documentation

## 2022-02-15 DIAGNOSIS — N939 Abnormal uterine and vaginal bleeding, unspecified: Secondary | ICD-10-CM | POA: Diagnosis not present

## 2022-02-15 HISTORY — PX: XI ROBOT ASSISTED DIAGNOSTIC LAPAROSCOPY: SHX6815

## 2022-02-15 LAB — POCT PREGNANCY, URINE: Preg Test, Ur: NEGATIVE

## 2022-02-15 LAB — ABO/RH: ABO/RH(D): O POS

## 2022-02-15 SURGERY — LAPAROSCOPY, DIAGNOSTIC, ROBOT-ASSISTED
Anesthesia: General

## 2022-02-15 MED ORDER — CHLORHEXIDINE GLUCONATE 0.12 % MT SOLN: 15.0000 mL | Freq: Once | OROMUCOSAL | Status: AC

## 2022-02-15 MED ORDER — OXYCODONE HCL 5 MG PO TABS
ORAL_TABLET | ORAL | Status: AC
  Filled 2022-02-15: qty 1

## 2022-02-15 MED ORDER — LIDOCAINE HCL (CARDIAC) PF 100 MG/5ML IV SOSY
PREFILLED_SYRINGE | INTRAVENOUS | Status: DC | PRN
  Administered 2022-02-15: 40 mg via INTRAVENOUS

## 2022-02-15 MED ORDER — SUGAMMADEX SODIUM 200 MG/2ML IV SOLN
INTRAVENOUS | Status: DC | PRN
  Administered 2022-02-15: 100 mg via INTRAVENOUS

## 2022-02-15 MED ORDER — SILVER NITRATE-POT NITRATE 75-25 % EX MISC
CUTANEOUS | Status: DC | PRN
  Administered 2022-02-15: 2 via TOPICAL

## 2022-02-15 MED ORDER — OXYCODONE HCL 5 MG/5ML PO SOLN: 5.0000 mg | Freq: Once | ORAL | Status: AC | PRN

## 2022-02-15 MED ORDER — SCOPOLAMINE 1 MG/3DAYS TD PT72: 1.0000 | MEDICATED_PATCH | TRANSDERMAL | Status: DC

## 2022-02-15 MED ORDER — CEFAZOLIN SODIUM-DEXTROSE 2-4 GM/100ML-% IV SOLN
INTRAVENOUS | Status: AC
  Filled 2022-02-15: qty 100

## 2022-02-15 MED ORDER — BUPIVACAINE HCL 0.5 % IJ SOLN
INTRAMUSCULAR | Status: DC | PRN
  Administered 2022-02-15: 7 mL

## 2022-02-15 MED ORDER — CHLORHEXIDINE GLUCONATE 0.12 % MT SOLN
OROMUCOSAL | Status: AC
  Filled 2022-02-15: qty 15

## 2022-02-15 MED ORDER — MIDAZOLAM HCL 2 MG/2ML IJ SOLN
INTRAMUSCULAR | Status: AC
  Filled 2022-02-15: qty 2

## 2022-02-15 MED ORDER — 0.9 % SODIUM CHLORIDE (POUR BTL) OPTIME
TOPICAL | Status: DC | PRN
  Administered 2022-02-15: 500 mL

## 2022-02-15 MED ORDER — CHLORHEXIDINE GLUCONATE 0.12 % MT SOLN
OROMUCOSAL | Status: AC
  Administered 2022-02-15: 15 mL via OROMUCOSAL
  Filled 2022-02-15: qty 15

## 2022-02-15 MED ORDER — ORAL CARE MOUTH RINSE: 15.0000 mL | Freq: Once | OROMUCOSAL | Status: AC

## 2022-02-15 MED ORDER — LACTATED RINGERS IV SOLN: INTRAVENOUS | Status: DC

## 2022-02-15 MED ORDER — MIDAZOLAM HCL 2 MG/2ML IJ SOLN
INTRAMUSCULAR | Status: DC | PRN
  Administered 2022-02-15 (×2): 1 mg via INTRAVENOUS

## 2022-02-15 MED ORDER — IBUPROFEN 600 MG PO TABS: 600.0000 mg | ORAL_TABLET | Freq: Four times a day (QID) | ORAL | 0 refills | Status: DC

## 2022-02-15 MED ORDER — FAMOTIDINE 20 MG PO TABS: 20.0000 mg | ORAL_TABLET | Freq: Once | ORAL | Status: AC

## 2022-02-15 MED ORDER — DROPERIDOL 2.5 MG/ML IJ SOLN
0.6250 mg | Freq: Once | INTRAMUSCULAR | Status: AC
  Administered 2022-02-15: 0.625 mg via INTRAVENOUS

## 2022-02-15 MED ORDER — FAMOTIDINE 20 MG PO TABS
ORAL_TABLET | ORAL | Status: AC
  Administered 2022-02-15: 20 mg via ORAL
  Filled 2022-02-15: qty 1

## 2022-02-15 MED ORDER — ONDANSETRON HCL 4 MG/2ML IJ SOLN
INTRAMUSCULAR | Status: DC | PRN
  Administered 2022-02-15: 4 mg via INTRAVENOUS

## 2022-02-15 MED ORDER — BUPIVACAINE HCL (PF) 0.5 % IJ SOLN
INTRAMUSCULAR | Status: AC
  Filled 2022-02-15: qty 30

## 2022-02-15 MED ORDER — OXYCODONE HCL 5 MG PO TABS
5.0000 mg | ORAL_TABLET | Freq: Once | ORAL | Status: AC | PRN
  Administered 2022-02-15: 5 mg via ORAL

## 2022-02-15 MED ORDER — FAMOTIDINE 20 MG PO TABS
ORAL_TABLET | ORAL | Status: AC
  Filled 2022-02-15: qty 1

## 2022-02-15 MED ORDER — DEXAMETHASONE SODIUM PHOSPHATE 10 MG/ML IJ SOLN
INTRAMUSCULAR | Status: DC | PRN
  Administered 2022-02-15: 4 mg via INTRAVENOUS

## 2022-02-15 MED ORDER — PROPOFOL 10 MG/ML IV BOLUS
INTRAVENOUS | Status: DC | PRN
  Administered 2022-02-15: 100 mg via INTRAVENOUS

## 2022-02-15 MED ORDER — FENTANYL CITRATE (PF) 100 MCG/2ML IJ SOLN
INTRAMUSCULAR | Status: AC
  Filled 2022-02-15: qty 2

## 2022-02-15 MED ORDER — TRANEXAMIC ACID-NACL 1000-0.7 MG/100ML-% IV SOLN
INTRAVENOUS | Status: AC
  Filled 2022-02-15: qty 100

## 2022-02-15 MED ORDER — DROPERIDOL 2.5 MG/ML IJ SOLN
INTRAMUSCULAR | Status: AC
  Filled 2022-02-15: qty 2

## 2022-02-15 MED ORDER — FENTANYL CITRATE (PF) 100 MCG/2ML IJ SOLN
INTRAMUSCULAR | Status: DC | PRN
  Administered 2022-02-15: 50 ug via INTRAVENOUS
  Administered 2022-02-15: 25 ug via INTRAVENOUS

## 2022-02-15 MED ORDER — HYDROMORPHONE HCL 1 MG/ML IJ SOLN
0.2500 mg | INTRAMUSCULAR | Status: DC | PRN
  Administered 2022-02-15 (×2): 0.5 mg via INTRAVENOUS

## 2022-02-15 MED ORDER — DEXAMETHASONE SODIUM PHOSPHATE 10 MG/ML IJ SOLN
INTRAMUSCULAR | Status: AC
  Filled 2022-02-15: qty 1

## 2022-02-15 MED ORDER — KETOROLAC TROMETHAMINE 30 MG/ML IJ SOLN
INTRAMUSCULAR | Status: DC | PRN
  Administered 2022-02-15: 20 mg via INTRAVENOUS

## 2022-02-15 MED ORDER — ROCURONIUM BROMIDE 100 MG/10ML IV SOLN
INTRAVENOUS | Status: DC | PRN
  Administered 2022-02-15: 30 mg via INTRAVENOUS
  Administered 2022-02-15 (×2): 15 mg via INTRAVENOUS

## 2022-02-15 MED ORDER — HYDROCODONE-ACETAMINOPHEN 5-325 MG PO TABS: 1.0000 | ORAL_TABLET | Freq: Four times a day (QID) | ORAL | 0 refills | Status: AC | PRN

## 2022-02-15 MED ORDER — HYDROMORPHONE HCL 1 MG/ML IJ SOLN
INTRAMUSCULAR | Status: AC
  Administered 2022-02-15: 0.5 mg via INTRAVENOUS
  Filled 2022-02-15: qty 1

## 2022-02-15 MED ORDER — SCOPOLAMINE 1 MG/3DAYS TD PT72
MEDICATED_PATCH | TRANSDERMAL | Status: AC
  Administered 2022-02-15: 1.5 mg via TRANSDERMAL
  Filled 2022-02-15: qty 1

## 2022-02-15 MED ORDER — PHENYLEPHRINE HCL (PRESSORS) 10 MG/ML IV SOLN
INTRAVENOUS | Status: DC | PRN
  Administered 2022-02-15 (×5): 80 ug via INTRAVENOUS

## 2022-02-15 MED ORDER — SEVOFLURANE IN SOLN
RESPIRATORY_TRACT | Status: AC
  Filled 2022-02-15: qty 250

## 2022-02-15 SURGICAL SUPPLY — 70 items
ADH SKN CLS APL DERMABOND .7 (GAUZE/BANDAGES/DRESSINGS) ×2
APL PRP STRL LF DISP 70% ISPRP (MISCELLANEOUS) ×2
BAG DRN RND TRDRP ANRFLXCHMBR (UROLOGICAL SUPPLIES) ×2
BAG URINE DRAIN 2000ML AR STRL (UROLOGICAL SUPPLIES) ×2 IMPLANT
BARRIER ADHS 3X4 INTERCEED (GAUZE/BANDAGES/DRESSINGS) IMPLANT
BLADE SURG SZ11 CARB STEEL (BLADE) ×2 IMPLANT
BRR ADH 4X3 ABS CNTRL BYND (GAUZE/BANDAGES/DRESSINGS) ×2
CATH FOLEY 2WAY  5CC 16FR (CATHETERS) ×2
CATH FOLEY 2WAY 5CC 16FR (CATHETERS) ×2
CATH URTH 16FR FL 2W BLN LF (CATHETERS) ×2 IMPLANT
CHLORAPREP W/TINT 26 (MISCELLANEOUS) ×2 IMPLANT
COVER TIP SHEARS 8 DVNC (MISCELLANEOUS) ×2 IMPLANT
COVER TIP SHEARS 8MM DA VINCI (MISCELLANEOUS) ×2
COVER WAND RF STERILE (DRAPES) ×2 IMPLANT
DERMABOND ADVANCED .7 DNX12 (GAUZE/BANDAGES/DRESSINGS) ×2 IMPLANT
DRAPE 3/4 80X56 (DRAPES) ×2 IMPLANT
DRAPE ARM DVNC X/XI (DISPOSABLE) ×6 IMPLANT
DRAPE COLUMN DVNC XI (DISPOSABLE) ×2 IMPLANT
DRAPE DA VINCI XI ARM (DISPOSABLE) ×6
DRAPE DA VINCI XI COLUMN (DISPOSABLE) ×2
DRAPE LEGGINS SURG 28X43 STRL (DRAPES) ×2 IMPLANT
DRAPE UNDER BUTTOCK W/FLU (DRAPES) ×2 IMPLANT
DRSG TELFA 3X4 N-ADH STERILE (GAUZE/BANDAGES/DRESSINGS) IMPLANT
ELECT CAUTERY BLADE 6.4 (BLADE) IMPLANT
ELECT REM PT RETURN 9FT ADLT (ELECTROSURGICAL) ×2
ELECTRODE REM PT RTRN 9FT ADLT (ELECTROSURGICAL) ×2 IMPLANT
GLOVE BIO SURGEON STRL SZ7 (GLOVE) ×4 IMPLANT
GLOVE SURG UNDER POLY LF SZ7.5 (GLOVE) ×6 IMPLANT
GOWN STRL REUS W/ TWL LRG LVL3 (GOWN DISPOSABLE) ×6 IMPLANT
GOWN STRL REUS W/TWL LRG LVL3 (GOWN DISPOSABLE) ×6
GRASPER SUT TROCAR 14GX15 (MISCELLANEOUS) IMPLANT
IRRIGATION STRYKERFLOW (MISCELLANEOUS) IMPLANT
IRRIGATOR STRYKERFLOW (MISCELLANEOUS)
IV NS 1000ML (IV SOLUTION) ×2
IV NS 1000ML BAXH (IV SOLUTION) ×2 IMPLANT
KIT IMAGING PINPOINTPAQ (MISCELLANEOUS) IMPLANT
KIT PINK PAD W/HEAD ARE REST (MISCELLANEOUS) ×2
KIT PINK PAD W/HEAD ARM REST (MISCELLANEOUS) ×2 IMPLANT
LABEL OR SOLS (LABEL) ×2 IMPLANT
MANIFOLD NEPTUNE II (INSTRUMENTS) ×2 IMPLANT
MANIPULATOR UTERINE 4.5 ZUMI (MISCELLANEOUS) IMPLANT
NEEDLE HYPO 22GX1.5 SAFETY (NEEDLE) ×2 IMPLANT
NS IRRIG 1000ML POUR BTL (IV SOLUTION) ×4 IMPLANT
OBTURATOR OPTICAL STANDARD 8MM (TROCAR) ×2
OBTURATOR OPTICAL STND 8 DVNC (TROCAR) ×2
OBTURATOR OPTICALSTD 8 DVNC (TROCAR) ×2 IMPLANT
PACK LAP CHOLECYSTECTOMY (MISCELLANEOUS) ×2 IMPLANT
PAD ARMBOARD 7.5X6 YLW CONV (MISCELLANEOUS) ×2 IMPLANT
PAD OB MATERNITY 4.3X12.25 (PERSONAL CARE ITEMS) ×2 IMPLANT
PAD PREP 24X41 OB/GYN DISP (PERSONAL CARE ITEMS) ×2 IMPLANT
SCISSORS METZENBAUM CVD 33 (INSTRUMENTS) IMPLANT
SCRUB CHG 4% DYNA-HEX 4OZ (MISCELLANEOUS) ×2 IMPLANT
SEAL CANN UNIV 5-8 DVNC XI (MISCELLANEOUS) ×6 IMPLANT
SEAL XI 5MM-8MM UNIVERSAL (MISCELLANEOUS) ×6
SEALER VESSEL DA VINCI XI (MISCELLANEOUS)
SEALER VESSEL EXT DVNC XI (MISCELLANEOUS) IMPLANT
SOL ELECTROSURG ANTI STICK (MISCELLANEOUS) ×2
SOLUTION ELECTROSURG ANTI STCK (MISCELLANEOUS) ×2 IMPLANT
SPONGE T-LAP 18X18 ~~LOC~~+RFID (SPONGE) IMPLANT
SURGILUBE 2OZ TUBE FLIPTOP (MISCELLANEOUS) ×2 IMPLANT
SUT MNCRL 4-0 (SUTURE) ×2
SUT MNCRL 4-0 27XMFL (SUTURE) ×2
SUT VICRYL 0 UR6 27IN ABS (SUTURE) IMPLANT
SUTURE MNCRL 4-0 27XMF (SUTURE) ×2 IMPLANT
SYR 10ML LL (SYRINGE) ×2 IMPLANT
TRAP FLUID SMOKE EVACUATOR (MISCELLANEOUS) ×2 IMPLANT
TROCAR 12M 150ML BLUNT (TROCAR) IMPLANT
TROCAR XCEL 12X100 BLDLESS (ENDOMECHANICALS) IMPLANT
TUBING EVAC SMOKE HEATED PNEUM (TUBING) ×2 IMPLANT
WATER STERILE IRR 500ML POUR (IV SOLUTION) ×2 IMPLANT

## 2022-02-15 NOTE — Anesthesia Preprocedure Evaluation (Signed)
Anesthesia Evaluation  Patient identified by MRN, date of birth, ID band Patient awake    Reviewed: Allergy & Precautions, NPO status , Patient's Chart, lab work & pertinent test results  History of Anesthesia Complications (+) PONV and history of anesthetic complications  Airway Mallampati: II  TM Distance: >3 FB Neck ROM: full    Dental no notable dental hx.    Pulmonary neg pulmonary ROS, former smoker   Pulmonary exam normal        Cardiovascular negative cardio ROS Normal cardiovascular exam     Neuro/Psych  PSYCHIATRIC DISORDERS Anxiety Depression    negative neurological ROS     GI/Hepatic Neg liver ROS,GERD  ,,  Endo/Other  negative endocrine ROSdiabetes    Renal/GU      Musculoskeletal   Abdominal   Peds  Hematology negative hematology ROS (+)   Anesthesia Other Findings Past Medical History: No date: Acid reflux No date: Anorexia No date: Anxiety No date: Depression No date: Diabetes mellitus without complication (HCC)     Comment:  gestational No date: Dysrhythmia     Comment:  POTS with no symptoms No date: Pneumonia No date: PONV (postoperative nausea and vomiting) No date: POTS (postural orthostatic tachycardia syndrome)  Past Surgical History: No date: APPENDECTOMY No date: DILATION AND CURETTAGE OF UTERUS No date: NASAL SEPTUM SURGERY No date: OVARIAN CYST REMOVAL  BMI    Body Mass Index: 15.66 kg/m      Reproductive/Obstetrics negative OB ROS                             Anesthesia Physical Anesthesia Plan  ASA: 2  Anesthesia Plan: General ETT   Post-op Pain Management: Toradol IV (intra-op)*, Ofirmev IV (intra-op)*, Ketamine IV* and Dilaudid IV   Induction: Intravenous  PONV Risk Score and Plan: 4 or greater and Ondansetron, Dexamethasone, Midazolam, Treatment may vary due to age or medical condition and Scopolamine patch - Pre-op  Airway  Management Planned: Oral ETT  Additional Equipment:   Intra-op Plan:   Post-operative Plan: Extubation in OR  Informed Consent: I have reviewed the patients History and Physical, chart, labs and discussed the procedure including the risks, benefits and alternatives for the proposed anesthesia with the patient or authorized representative who has indicated his/her understanding and acceptance.     Dental Advisory Given  Plan Discussed with: Anesthesiologist, CRNA and Surgeon  Anesthesia Plan Comments: (Patient consented for risks of anesthesia including but not limited to:  - adverse reactions to medications - damage to eyes, teeth, lips or other oral mucosa - nerve damage due to positioning  - sore throat or hoarseness - Damage to heart, brain, nerves, lungs, other parts of body or loss of life  Patient voiced understanding.)        Anesthesia Quick Evaluation

## 2022-02-15 NOTE — Transfer of Care (Signed)
Immediate Anesthesia Transfer of Care Note  Patient: Alyssa Evans  Procedure(s) Performed: XI ROBOT ASSISTED DIAGNOSTIC LAPAROSCOPY LAPAROSCOPIC EXCISION OF ENDOMETRIOSIS  Patient Location: PACU  Anesthesia Type:General  Level of Consciousness: awake and alert   Airway & Oxygen Therapy: Patient Spontanous Breathing and Patient connected to face mask oxygen  Post-op Assessment: Report given to RN  Post vital signs: Reviewed and stable  Last Vitals:  Vitals Value Taken Time  BP 108/69 02/15/22 1252  Temp 36.1 C 02/15/22 1252  Pulse 85 02/15/22 1258  Resp 18 02/15/22 1258  SpO2 100 % 02/15/22 1258  Vitals shown include unvalidated device data.  Last Pain:  Vitals:   02/15/22 0834  TempSrc: Temporal  PainSc: 0-No pain         Complications: No notable events documented.

## 2022-02-15 NOTE — Anesthesia Postprocedure Evaluation (Signed)
Anesthesia Post Note  Patient: Alyssa Evans  Procedure(s) Performed: XI ROBOT ASSISTED DIAGNOSTIC LAPAROSCOPY LAPAROSCOPIC EXCISION OF ENDOMETRIOSIS  Patient location during evaluation: PACU Anesthesia Type: General Level of consciousness: awake and alert Pain management: pain level controlled Vital Signs Assessment: post-procedure vital signs reviewed and stable Respiratory status: spontaneous breathing, nonlabored ventilation, respiratory function stable and patient connected to nasal cannula oxygen Cardiovascular status: blood pressure returned to baseline and stable Postop Assessment: no apparent nausea or vomiting Anesthetic complications: no   No notable events documented.   Last Vitals:  Vitals:   02/15/22 1330 02/15/22 1335  BP: 110/67   Pulse: 74 73  Resp: 12 11  Temp:    SpO2: 100% 100%    Last Pain:  Vitals:   02/15/22 1335  TempSrc:   PainSc: 4                  Ilene Qua

## 2022-02-15 NOTE — Discharge Instructions (Signed)

## 2022-02-15 NOTE — Op Note (Signed)
Operative Note    Name: Alyssa Evans  Date of Service: 02/15/2022  DOB: [birth year]  MRN: [id number]   Pre-Operative Diagnosis:  1) Chronic pelvic pain in female 2) Dyspareunia, female 3) Abnormal uterine bleeding  Post-Operative Diagnosis:  1) Chronic pelvic pain in female 2) Dyspareunia, female 3) Abnormal uterine bleeding  Procedures:  1) Robot assisted diagnostic laparoscopy 2) Excision of endometriosis  Primary Surgeon: Prentice Docker, MD   EBL: 5 mL   IVF: 650 mL   Urine output: 200 mL clear urine at the end of the procedure  Specimens:  1) Right posterior fossa peritoneum 2) Right uterosacral ligament peritoneum 3) Right posterior cul-de-sac peritoneum 4) Left ovarian fossa periteneum  Drains: none  Complications: None   Disposition: PACU   Condition: Stable   Findings:  1) normal-appearing uterus, bilateral fallopian tubes, and ovaries 2) adhesion of sigmoid colon to left pelvic brim 3) absence of appendix, consistent with surgical history 4) various surgical staples noted along the right pelvic brim and in the right posterior cul-de-sac 5) powder burn lesions noted in the right ovarian fossa very lateral, right uterosacral ligament, right posterior cul-de-sac associated also with surgical staples, and left ovarian fossa  Procedure Summary:  The patient was taken to the operating room where general anesthesia was administered and found to be adequate. She was placed in the dorsal supine lithotomy position in [place] stirrups and prepped and draped in usual sterile fashion. After a timeout was called an indwelling catheter was placed in her bladder.  A sterile speculum was placed in her vagina.  The cervix was visualized and the anterior cervix was grasped with a single-tooth tenaculum.  The ZUMI uterine manipulator was inserted and the cervix after gentle dilation in accordance with the manufacturer's recommendations.  Attention was turned to the  abdomen where after injection of local anesthetic, an 8 mm infraumbilical incision was made with the scalpel. Entry into the abdomen was obtained via Optiview trocar technique (a blunt entry technique with camera visualization through the obturator upon entry). Verification of entry into the abdomen was obtained using opening pressures. The abdomen was insufflated with CO2. The camera was introduced through the trocar with verification of atraumatic entry.  Right and left abdominal entry sites were created after injection of local anesthetic about 8 cm away from the umbilical port in accordance with the Intuitive manufacturer's recommendations.  The port sites were 8 mm.  The intuitive trochars were introduced under intra-abdominal camera visualization without difficulty.  The XI robot was docked on the patient's left.  Clearance was verified from the patient's legs.  Through the umbilical port the camera was placed.  Through the port attached to arm 4 the monopolar scissors were placed.  Through the port attached to arm 2 the forced bipolar forceps were was placed.    An inspection of the pelvis was undertaken with the above-noted findings.  In order to better visualize the left ovary and fallopian tube the adhesions of the bowel to the left pelvic brim were taken down.  Both ureters were easily visualized.  Attention was turned to the right ovarian fossa.  The powder burn lesion was well lateral.  A small portion of peritoneum was tented up and entered superficially with the scissors.  A circumferential excision was made with visualization of the scissors through the peritoneum in order to remove the lesion.  No significant depth was noted of the lesion to the underlying structures.  This was passed off for permanent pathology.  Attention was turned to the right uterosacral ligament.  After visualizing that the ureter was well away from this area, the peritoneum overlying the ligament was tented up and entered  superficially with the scissors.  Again, in a superficial fashion the peritoneum was excised in a circumferential fashion around the powder burn lesion.  The lesion was mildly adherent to the underlying ligament and gently dissected away.  This specimen was also handed off in a similar fashion.  Attention was turned to the posterior cul-de-sac on the right side.  This was a larger area.  Using the same technique as prior a circumferential excision of peritoneum was undertaken.  There was 1 area of especially dense underlying tissue adhesion where there were a couple of staples at the surface.  Because this area could not be dissected away no further attempt was made to remove this area.  However, the rest of the excision was undertaken without difficulty.  The specimen was passed off.  Attention was turned to the left ovarian fossa.  A large ovoid section of peritoneum was taken in this area.  All powder burn lesions were noted to be superficial in this area.  The specimen was passed off.  All areas where biopsies were taken were inspected and found to be hemostatic.  Interceed was placed over each of these areas to minimize risk of adhesive disease.  No other areas of endometriosis were noted.  Therefore, the procedure was terminated.  All instruments were removed from the trocars.  The robot was undocked.  The abdomen was emptied of CO2 with the aid of 5 deep breaths from anesthesia.  All trocars were removed.  The skin incisions were closed using 4-0 Monocryl in a subcutaneous fashion.  All incisions were covered with surgical skin glue.  Attention was turned to the pelvis.  The Foley catheter was removed.  A sterile speculum was placed in the vagina.  The [place] manipulator was removed.  Silver nitrate was applied to the areas where the tenaculum had been placed.  Hemostasis was obtained.  No bleeding was noted at the external cervical os.  Speculum was removed after verifying no instruments or sponges  remained.  The patient tolerated the procedure well.  Sponge, lap, needle, and instrument counts were correct x 2.  VTE prophylaxis: SCDs. Antibiotic prophylaxis: none indicated.Marland Kitchen She was awakened in the operating room and was taken to the PACU in stable condition.   Prentice Docker, MD 02/15/2022 12:42 PM

## 2022-02-15 NOTE — Interval H&P Note (Signed)
History and Physical Interval Note:  02/15/2022 9:40 AM  Alyssa Evans  has presented today for surgery, with the diagnosis of chronic pelvic pain.  The various methods of treatment have been discussed with the patient and family. After consideration of risks, benefits and other options for treatment, the patient has consented to  Procedure(s): XI ROBOT ASSISTED DIAGNOSTIC LAPAROSCOPY (N/A) as a surgical intervention.  The patient's history has been reviewed, patient examined, no change in status, stable for surgery.  I have reviewed the patient's chart and labs.  Questions were answered to the patient's satisfaction.  Consents reviewed and patient wishes to proceed.  Prentice Docker, MD, [facility] 02/15/2022 9:40 AM

## 2022-02-16 ENCOUNTER — Encounter: Payer: Self-pay | Admitting: Obstetrics and Gynecology

## 2022-02-19 LAB — SURGICAL PATHOLOGY

## 2023-08-28 ENCOUNTER — Ambulatory Visit (INDEPENDENT_AMBULATORY_CARE_PROVIDER_SITE_OTHER): Payer: Self-pay

## 2023-08-28 ENCOUNTER — Ambulatory Visit (HOSPITAL_COMMUNITY)
Admission: RE | Admit: 2023-08-28 | Discharge: 2023-08-28 | Disposition: A | Discharge status: Home / Self Care | Payer: Self-pay | Source: Ambulatory Visit | Attending: Internal Medicine | Admitting: Internal Medicine

## 2023-08-28 ENCOUNTER — Encounter (HOSPITAL_COMMUNITY): Payer: Self-pay

## 2023-08-28 ENCOUNTER — Ambulatory Visit (HOSPITAL_COMMUNITY): Payer: Self-pay | Admitting: Internal Medicine

## 2023-08-28 ENCOUNTER — Other Ambulatory Visit (HOSPITAL_COMMUNITY): Payer: Self-pay

## 2023-08-28 VITALS — BP 115/77 | HR 86 | Temp 98.2°F | Resp 16

## 2023-08-28 DIAGNOSIS — S61451A Open bite of right hand, initial encounter: Secondary | ICD-10-CM

## 2023-08-28 DIAGNOSIS — W5501XA Bitten by cat, initial encounter: Secondary | ICD-10-CM

## 2023-08-28 DIAGNOSIS — Z23 Encounter for immunization: Secondary | ICD-10-CM

## 2023-08-28 DIAGNOSIS — S61259A Open bite of unspecified finger without damage to nail, initial encounter: Secondary | ICD-10-CM

## 2023-08-28 DIAGNOSIS — L089 Local infection of the skin and subcutaneous tissue, unspecified: Secondary | ICD-10-CM

## 2023-08-28 MED ORDER — AMOXICILLIN-POT CLAVULANATE 875-125 MG PO TABS
1.0000 | ORAL_TABLET | Freq: Two times a day (BID) | ORAL | 0 refills | Status: AC
  Filled 2023-08-28: qty 14, 7d supply, fill #0

## 2023-08-28 MED ORDER — TETANUS-DIPHTH-ACELL PERTUSSIS 5-2.5-18.5 LF-MCG/0.5 IM SUSY
PREFILLED_SYRINGE | INTRAMUSCULAR | Status: AC
  Filled 2023-08-28: qty 0.5

## 2023-08-28 MED ORDER — MUPIROCIN 2 % EX OINT
1.0000 | TOPICAL_OINTMENT | Freq: Two times a day (BID) | CUTANEOUS | 0 refills | Status: AC
  Filled 2023-08-28: qty 22, 30d supply, fill #0

## 2023-08-28 MED ORDER — TETANUS-DIPHTH-ACELL PERTUSSIS 5-2.5-18.5 LF-MCG/0.5 IM SUSY
0.5000 mL | PREFILLED_SYRINGE | Freq: Once | INTRAMUSCULAR | Status: AC
  Administered 2023-08-28: 0.5 mL via INTRAMUSCULAR

## 2023-08-28 MED ORDER — AMOXICILLIN-POT CLAVULANATE 875-125 MG PO TABS: 1.0000 | ORAL_TABLET | Freq: Two times a day (BID) | ORAL | 0 refills | Status: DC

## 2023-08-28 MED ORDER — MUPIROCIN 2 % EX OINT: 1.0000 | TOPICAL_OINTMENT | Freq: Two times a day (BID) | CUTANEOUS | 0 refills | Status: DC

## 2023-08-28 NOTE — ED Triage Notes (Signed)
 Pt states bit by her cat on Sunday evening to rt pointer finger. States now red, heat, and swollen. States her cat is up to date on shots.

## 2023-08-28 NOTE — Discharge Instructions (Addendum)
 Take Augmentin  anitbiotic to prevent/treat infection to the animal bite. Apply mupirocin  ointment twice a day to promote wound healing to the soft tissues.  Wound care: gently cleanse site at least 2 times a day and dress with non-stick gauze and wrap. Keep wound open to air at nighttime as long as it is not draining.  Watch for any new or worsening signs of infection such as redness, swelling, pus, pain, or fever/chills.   I have outlined the area of redness with a skin pen today.  If the redness grows significantly past the area outlined on your hand in the next 24 to 48 hours, please go to the nearest emergency room for further workup and evaluation.  You may take over the counter medicines as needed for pain.  Schedule an appointment for wound check with PCP in 5-7 days to ensure this is healing appropriately.

## 2023-08-28 NOTE — ED Provider Notes (Addendum)
 [facility]    CSN: [id number] Arrival date & time: 08/28/23  9065      History   Chief Complaint Chief Complaint  Patient presents with   appt - cat bite    HPI Alyssa Evans is a 28 y.o. female.   Alyssa Evans is a 28 y.o. female presenting for chief complaint of cat bite to the right pointer finger at the proximal phalanx 3 days ago on Sunday, August 25, 2023.  Her cat was injured and she was on the way to bringing him to the veterinary emergency room when he bit her finger.  Her cat ended up having to be put to sleep, however she states he was up to date on his rabies vaccines prior to his passing.  Redness and swelling have developed and spread over the last 2 to 3 days since bite.  Reports numbness and tingling to the tip of the right finger, no new color or temperature changes to the tip of the right index finger.  History of gestational diabetes, no current immunosuppression reported.  Denies recent antibiotic or steroid use in the last 90 days.  Last tetanus injection was 6 years ago.     Past Medical History:  Diagnosis Date   Acid reflux    Anorexia    Anxiety    Depression    Diabetes mellitus without complication (HCC)    gestational   Dysrhythmia    POTS with no symptoms   Pneumonia    PONV (postoperative nausea and vomiting)    POTS (postural orthostatic tachycardia syndrome)     Patient Active Problem List   Diagnosis Date Noted   Chronic pelvic pain in female 02/15/2022   Dyspareunia in female 02/15/2022   Abnormal uterine bleeding 02/15/2022    Past Surgical History:  Procedure Laterality Date   APPENDECTOMY     DILATION AND CURETTAGE OF UTERUS     NASAL SEPTUM SURGERY     OVARIAN CYST REMOVAL     XI ROBOT ASSISTED DIAGNOSTIC LAPAROSCOPY N/A 02/15/2022   Procedure: XI ROBOT ASSISTED DIAGNOSTIC LAPAROSCOPY;  Surgeon: Leonce Garnette BIRCH, MD;  Location: [facility];  Service: Gynecology;  Laterality: N/A;    OB  History     Gravida  1   Para      Term      Preterm      AB      Living         SAB      IAB      Ectopic      Multiple      Live Births               Home Medications    Prior to Admission medications   Medication Sig Start Date End Date Taking? Authorizing Provider  amoxicillin -clavulanate (AUGMENTIN ) 875-125 MG tablet Take 1 tablet by mouth every 12 (twelve) hours. 08/28/23  Yes Enedelia Dorna HERO, FNP  mupirocin  ointment (BACTROBAN ) 2 % Apply 1 Application topically 2 (two) times daily. 08/28/23  Yes Enedelia Dorna HERO, FNP  albuterol  (PROVENTIL  HFA;VENTOLIN  HFA) 108 (90 Base) MCG/ACT inhaler Inhale into the lungs. 10/21/14 09/05/18  [provider]  fluticasone  (FLONASE ) 50 MCG/ACT nasal spray Place 2 sprays into both nostrils daily. Patient taking differently: Place 2 sprays into both nostrils daily as needed.  09/05/15 09/05/18  Hagler, Jami L, PA-C  promethazine  (PHENERGAN ) 12.5 MG tablet Take 1 tablet (12.5 mg total) by mouth every 6 (  six) hours as needed for nausea or vomiting. 03/07/18 09/05/18  Herlinda Kirk NOVAK, FNP    Family History Family History  Problem Relation Age of Onset   Hyperlipidemia Mother    Hypertension Mother    Diabetes gravidarum Mother    Other Father        unknown medical history   Heart disease Brother 47       ? slight heart attack   Syncope episode Brother    Heart attack Maternal Grandmother     Social History Social History   Tobacco Use   Smoking status: Former    Current packs/day: 0.00    Average packs/day: 0.5 packs/day for 3.0 years (1.5 ttl pk-yrs)    Types: Cigarettes    Start date: 10/06/2015    Quit date: 10/06/2018    Years since quitting: 4.8   Smokeless tobacco: Never   Tobacco comments:    somedays pt smoke pack a day.  Vaping Use   Vaping status: Some Days   Substances: Nicotine, Flavoring  Substance Use Topics   Alcohol use: Not Currently    Alcohol/week: 1.0 standard drink of  alcohol    Types: 1 Cans of beer per week    Comment: socially   Drug use: Not Currently    Types: Marijuana     Allergies   Patient has no known allergies.   Review of Systems Review of Systems Per HPI  Physical Exam Triage Vital Signs ED Triage Vitals [08/28/23 0959]  Encounter Vitals Group     BP 115/77     Girls Systolic BP Percentile      Girls Diastolic BP Percentile      Boys Systolic BP Percentile      Boys Diastolic BP Percentile      Pulse Rate 86     Resp 16     Temp 98.2 F (36.8 C)     Temp Source Oral     SpO2 97 %     Weight      Height      Head Circumference      Peak Flow      Pain Score 4     Pain Loc      Pain Education      Exclude from Growth Chart    No data found.  Updated Vital Signs BP 115/77 (BP Location: Left Arm)   Pulse 86   Temp 98.2 F (36.8 C) (Oral)   Resp 16   LMP 08/19/2023 (Exact Date)   SpO2 97%   Breastfeeding No   Visual Acuity Right Eye Distance:   Left Eye Distance:   Bilateral Distance:    Right Eye Near:   Left Eye Near:    Bilateral Near:     Physical Exam Vitals and nursing note reviewed.  Constitutional:      Appearance: She is not ill-appearing or toxic-appearing.  HENT:     Head: Normocephalic and atraumatic.     Right Ear: Hearing and external ear normal.     Left Ear: Hearing and external ear normal.     Nose: Nose normal.     Mouth/Throat:     Lips: Pink.  Eyes:     General: Lids are normal. Vision grossly intact. Gaze aligned appropriately.     Extraocular Movements: Extraocular movements intact.     Conjunctiva/sclera: Conjunctivae normal.  Pulmonary:     Effort: Pulmonary effort is normal.  Musculoskeletal:     Right hand: Swelling, tenderness  and bony tenderness present. No deformity. Decreased range of motion (Decreased ROM of the right second MCP secondary to pain and swelling). Normal strength (5/5 strength with flexion and extension of the right pointer finger). Normal sensation  (Sensation intact to distal right pointer finger). There is no disruption of two-point discrimination. Normal capillary refill. Normal pulse.     Left hand: Normal.     Cervical back: Neck supple.     Comments: +2 right radial pulse, less than 2 cap refill to right index finger.  Erythema, warmth, and swelling over the right index finger spreading proximally to the dorsum of the right hand.  Skin:    General: Skin is warm and dry.     Capillary Refill: Capillary refill takes less than 2 seconds.     Findings: Lesion (Bite lesion to the proximal phalanx of the right index finger with surrounding erythema and swelling as seen in image below.) present. No rash.  Neurological:     General: No focal deficit present.     Mental Status: She is alert and oriented to person, place, and time. Mental status is at baseline.     Cranial Nerves: No dysarthria or facial asymmetry.  Psychiatric:        Mood and Affect: Mood normal.        Speech: Speech normal.        Behavior: Behavior normal.        Thought Content: Thought content normal.        Judgment: Judgment normal.    Cat bite of right hand   UC Treatments / Results  Labs (all labs ordered are listed, but only abnormal results are displayed) Labs Reviewed - No data to display  EKG   Radiology No results found.  Procedures Procedures (including critical care time)  Medications Ordered in UC Medications  Tdap (BOOSTRIX ) injection 0.5 mL (0.5 mLs Intramuscular Given 08/28/23 1025)    Initial Impression / Assessment and Plan / UC Course  I have reviewed the triage vital signs and the nursing notes.  Pertinent labs & imaging results that were available during my care of the patient were reviewed by me and considered in my medical decision making (see chart for details).   1.  Cat bite of right hand including fingers with infection, need for tetanus booster Animal is up to date on rabies vaccination, therefore no need for  Kedrab/rabies vaccine series.   Imaging: Right hand x-rays unremarkable for acute bony abnormality, radiology read read pending at time of discharge.  Staff will call if radiology reread shows abnormality requiring change in treatment plan.  Neurovascularly intact currently, however return precautions have been discussed regarding compartment syndrome.  Augmentin  twice daily for 7 days to treat cellulitis of the right finger caused by cat bite. Topical mupirocin  twice daily for 7 days.  Wound cleansed and dressed in clinic, discussed wound care for home and infection return precautions.    Tetanus booster given today.  Counseled patient on potential for adverse effects with medications prescribed/recommended today, strict ER and return-to-clinic precautions discussed, patient verbalized understanding.    Final Clinical Impressions(s) / UC Diagnoses   Final diagnoses:  Cat bite of right hand including fingers with infection, initial encounter  Need for tetanus booster     Discharge Instructions      Take Augmentin  anitbiotic to prevent/treat infection to the animal bite. Apply mupirocin  ointment twice a day to promote wound healing to the soft tissues.  Wound care: gently  cleanse site at least 2 times a day and dress with non-stick gauze and wrap. Keep wound open to air at nighttime as long as it is not draining.  Watch for any new or worsening signs of infection such as redness, swelling, pus, pain, or fever/chills.   I have outlined the area of redness with a skin pen today.  If the redness grows significantly past the area outlined on your hand in the next 24 to 48 hours, please go to the nearest emergency room for further workup and evaluation.  You may take over the counter medicines as needed for pain.  Schedule an appointment for wound check with PCP in 5-7 days to ensure this is healing appropriately.      ED Prescriptions     Medication Sig Dispense Auth. Provider    amoxicillin -clavulanate (AUGMENTIN ) 875-125 MG tablet Take 1 tablet by mouth every 12 (twelve) hours. 14 tablet Danila Eddie M, FNP   mupirocin  ointment (BACTROBAN ) 2 % Apply 1 Application topically 2 (two) times daily. 22 g Enedelia Dorna HERO, FNP      PDMP not reviewed this encounter.   Enedelia Dorna HERO, FNP 08/28/23 1050    Enedelia Dorna Har[place], [place] 08/28/23 1051
